# Patient Record
Sex: Male | Born: 1985 | Race: White | Hispanic: Yes | Marital: Married | Smoking: Current every day smoker
Health system: Southern US, Community
[De-identification: ages and names within clinical notes are randomized; demographics above are authoritative.]

## PROBLEM LIST (undated history)

## (undated) HISTORY — PX: HERNIA REPAIR: SHX51

---
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
## 2003-05-01 ENCOUNTER — Emergency Department (HOSPITAL_COMMUNITY): Admission: EM | Admit: 2003-05-01 | Discharge: 2003-05-02 | Payer: Self-pay | Admitting: Emergency Medicine

## 2003-07-07 ENCOUNTER — Emergency Department (HOSPITAL_COMMUNITY): Admission: EM | Admit: 2003-07-07 | Discharge: 2003-07-07 | Payer: Self-pay | Admitting: Emergency Medicine

## 2004-02-14 ENCOUNTER — Emergency Department (HOSPITAL_COMMUNITY): Admission: EM | Admit: 2004-02-14 | Discharge: 2004-02-14 | Payer: Self-pay | Admitting: Emergency Medicine

## 2004-02-17 ENCOUNTER — Emergency Department (HOSPITAL_COMMUNITY): Admission: EM | Admit: 2004-02-17 | Discharge: 2004-02-17 | Payer: Self-pay | Admitting: Emergency Medicine

## 2005-01-17 ENCOUNTER — Emergency Department (HOSPITAL_COMMUNITY): Admission: EM | Admit: 2005-01-17 | Discharge: 2005-01-17 | Payer: Self-pay | Admitting: Family Medicine

## 2005-07-24 ENCOUNTER — Emergency Department (HOSPITAL_COMMUNITY): Admission: EM | Admit: 2005-07-24 | Discharge: 2005-07-24 | Payer: Self-pay | Admitting: Emergency Medicine

## 2005-08-30 ENCOUNTER — Emergency Department (HOSPITAL_COMMUNITY): Admission: EM | Admit: 2005-08-30 | Discharge: 2005-08-30 | Payer: Self-pay | Admitting: Emergency Medicine

## 2005-09-03 ENCOUNTER — Emergency Department (HOSPITAL_COMMUNITY): Admission: EM | Admit: 2005-09-03 | Discharge: 2005-09-03 | Payer: Self-pay | Admitting: *Deleted

## 2006-01-23 ENCOUNTER — Emergency Department (HOSPITAL_COMMUNITY): Admission: EM | Admit: 2006-01-23 | Discharge: 2006-01-23 | Payer: Self-pay | Admitting: Emergency Medicine

## 2006-01-29 ENCOUNTER — Emergency Department (HOSPITAL_COMMUNITY): Admission: EM | Admit: 2006-01-29 | Discharge: 2006-01-29 | Payer: Self-pay | Admitting: Emergency Medicine

## 2006-02-13 ENCOUNTER — Emergency Department (HOSPITAL_COMMUNITY): Admission: EM | Admit: 2006-02-13 | Discharge: 2006-02-13 | Payer: Self-pay | Admitting: Emergency Medicine

## 2006-11-29 ENCOUNTER — Emergency Department (HOSPITAL_COMMUNITY): Admission: EM | Admit: 2006-11-29 | Discharge: 2006-11-29 | Payer: Self-pay | Admitting: Emergency Medicine

## 2007-08-15 IMAGING — CR DG CHEST 1V PORT
1 series · 1 of 1 positions shown · non-contrast
Comparison: 01/23/2006

CLINICAL DATA: Cough and congestion.  
 PORTABLE CHEST - 02/13/2006:

[view not recorded]
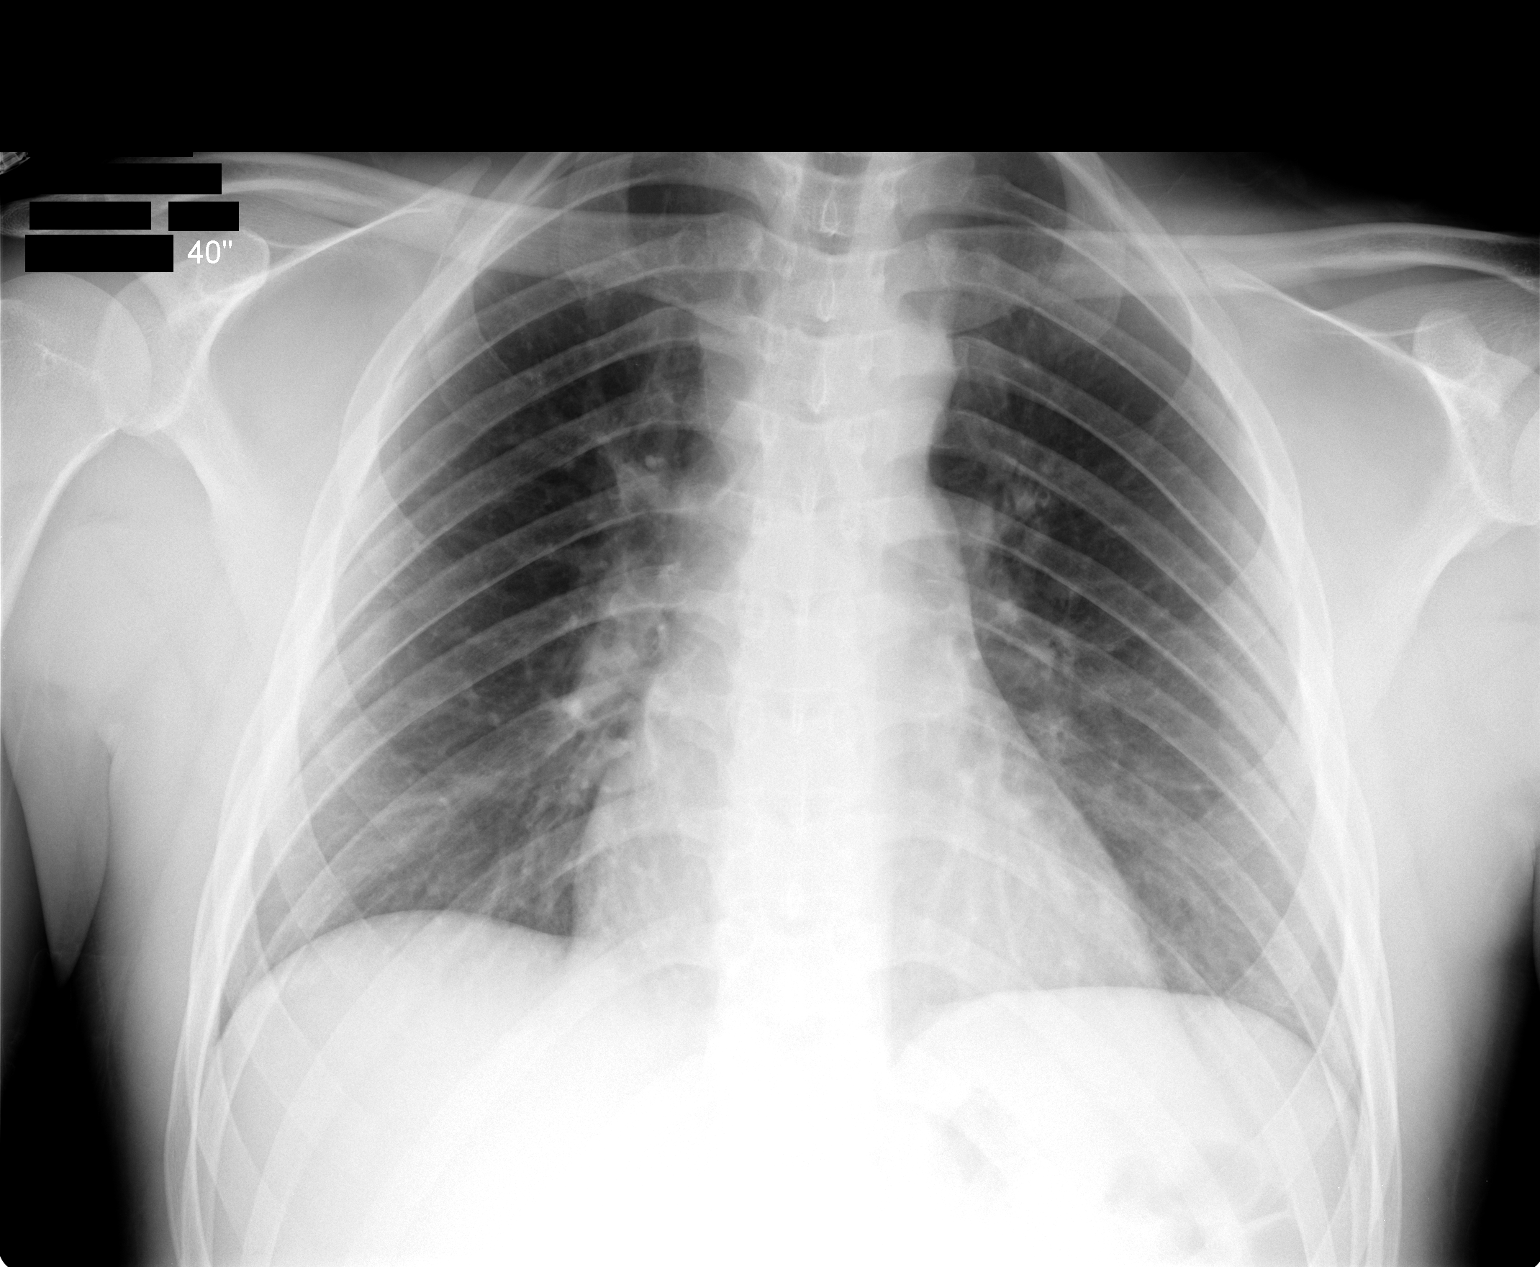

[1 of 1 positions shown; findings below may reference images not displayed]

FINDINGS: Lungs clear.  Costophrenic angle is sharp.  Heart size is normal.  No focal bony abnormality.
IMPRESSION: No acute disease.

## 2008-01-08 ENCOUNTER — Emergency Department (HOSPITAL_COMMUNITY): Admission: EM | Admit: 2008-01-08 | Discharge: 2008-01-08 | Payer: Self-pay | Admitting: Emergency Medicine

## 2008-01-15 ENCOUNTER — Emergency Department (HOSPITAL_COMMUNITY): Admission: EM | Admit: 2008-01-15 | Discharge: 2008-01-15 | Payer: Self-pay | Admitting: Emergency Medicine

## 2008-01-16 ENCOUNTER — Emergency Department (HOSPITAL_COMMUNITY): Admission: EM | Admit: 2008-01-16 | Discharge: 2008-01-17 | Payer: Self-pay | Admitting: Emergency Medicine

## 2008-01-18 ENCOUNTER — Emergency Department (HOSPITAL_COMMUNITY): Admission: EM | Admit: 2008-01-18 | Discharge: 2008-01-19 | Payer: Self-pay | Admitting: Emergency Medicine

## 2008-02-22 ENCOUNTER — Emergency Department (HOSPITAL_COMMUNITY): Admission: EM | Admit: 2008-02-22 | Discharge: 2008-02-22 | Payer: Self-pay | Admitting: Emergency Medicine

## 2009-04-13 ENCOUNTER — Emergency Department (HOSPITAL_COMMUNITY): Admission: EM | Admit: 2009-04-13 | Discharge: 2009-04-13 | Payer: Self-pay | Admitting: Family Medicine

## 2009-09-11 ENCOUNTER — Emergency Department (HOSPITAL_COMMUNITY): Admission: EM | Admit: 2009-09-11 | Discharge: 2009-09-11 | Payer: Self-pay | Admitting: Emergency Medicine

## 2009-11-11 ENCOUNTER — Emergency Department (HOSPITAL_COMMUNITY): Admission: EM | Admit: 2009-11-11 | Discharge: 2009-11-12 | Payer: Self-pay | Admitting: Emergency Medicine

## 2010-04-27 ENCOUNTER — Emergency Department (HOSPITAL_COMMUNITY): Payer: Self-pay

## 2010-04-27 ENCOUNTER — Emergency Department (HOSPITAL_COMMUNITY)
Admission: EM | Admit: 2010-04-27 | Discharge: 2010-04-27 | Disposition: A | Discharge status: Home / Self Care | Payer: Self-pay | Attending: Emergency Medicine | Admitting: Emergency Medicine

## 2010-04-27 DIAGNOSIS — S0003XA Contusion of scalp, initial encounter: Secondary | ICD-10-CM | POA: Insufficient documentation

## 2010-04-27 DIAGNOSIS — S1093XA Contusion of unspecified part of neck, initial encounter: Secondary | ICD-10-CM | POA: Insufficient documentation

## 2010-04-27 DIAGNOSIS — S0990XA Unspecified injury of head, initial encounter: Secondary | ICD-10-CM | POA: Insufficient documentation

## 2010-04-27 DIAGNOSIS — S025XXA Fracture of tooth (traumatic), initial encounter for closed fracture: Secondary | ICD-10-CM | POA: Insufficient documentation

## 2010-05-12 LAB — URINALYSIS, ROUTINE W REFLEX MICROSCOPIC
Bilirubin Urine: NEGATIVE
Glucose, UA: NEGATIVE mg/dL
Hgb urine dipstick: NEGATIVE
Ketones, ur: NEGATIVE mg/dL
Nitrite: NEGATIVE
Protein, ur: NEGATIVE mg/dL
Specific Gravity, Urine: 1.042 — ABNORMAL HIGH (ref 1.005–1.030)
Urobilinogen, UA: 0.2 mg/dL (ref 0.0–1.0)
pH: 5.5 (ref 5.0–8.0)

## 2010-05-12 LAB — COMPREHENSIVE METABOLIC PANEL
ALT: 28 U/L (ref 0–53)
AST: 29 U/L (ref 0–37)
Albumin: 4.4 g/dL (ref 3.5–5.2)
Alkaline Phosphatase: 62 U/L (ref 39–117)
BUN: 10 mg/dL (ref 6–23)
CO2: 24 mEq/L (ref 19–32)
Calcium: 9.2 mg/dL (ref 8.4–10.5)
Chloride: 105 mEq/L (ref 96–112)
Creatinine, Ser: 0.89 mg/dL (ref 0.4–1.5)
GFR calc Af Amer: >60 mL/min (ref >60)
GFR calc non Af Amer: >60 mL/min (ref >60)
Glucose, Bld: 97 mg/dL (ref 70–99)
Potassium: 3.9 mEq/L (ref 3.5–5.1)
Sodium: 138 mEq/L (ref 135–145)
Total Bilirubin: 1.1 mg/dL (ref 0.3–1.2)
Total Protein: 7.3 g/dL (ref 6.0–8.3)

## 2010-05-12 LAB — DIFFERENTIAL
Basophils Absolute: 0 10*3/uL (ref 0.0–0.1)
Basophils Relative: 0 % (ref 0–1)
Eosinophils Absolute: 0.1 10*3/uL (ref 0.0–0.7)
Eosinophils Relative: 2 % (ref 0–5)
Lymphocytes Relative: 18 % (ref 12–46)
Lymphs Abs: 1.1 10*3/uL (ref 0.7–4.0)
Monocytes Absolute: 0.2 10*3/uL (ref 0.1–1.0)
Monocytes Relative: 3 % (ref 3–12)
Neutro Abs: 4.8 10*3/uL (ref 1.7–7.7)
Neutrophils Relative %: 77 % (ref 43–77)

## 2010-05-12 LAB — CBC
HCT: 45.3 % (ref 39.0–52.0)
Hemoglobin: 15.7 g/dL (ref 13.0–17.0)
MCH: 32.2 pg (ref 26.0–34.0)
MCHC: 34.6 g/dL (ref 30.0–36.0)
MCV: 93.2 fL (ref 78.0–100.0)
Platelets: 186 10*3/uL (ref 150–400)
RBC: 4.86 MIL/uL (ref 4.22–5.81)
RDW: 11.8 % (ref 11.5–15.5)
WBC: 6.2 10*3/uL (ref 4.0–10.5)

## 2010-05-14 LAB — URINE MICROSCOPIC-ADD ON

## 2010-05-14 LAB — URINALYSIS, ROUTINE W REFLEX MICROSCOPIC
Bilirubin Urine: NEGATIVE
Glucose, UA: NEGATIVE mg/dL
Ketones, ur: NEGATIVE mg/dL
Leukocytes, UA: NEGATIVE
Nitrite: NEGATIVE
Protein, ur: NEGATIVE mg/dL
Specific Gravity, Urine: 1.023 (ref 1.005–1.030)
Urobilinogen, UA: 0.2 mg/dL (ref 0.0–1.0)
pH: 6.5 (ref 5.0–8.0)

## 2010-05-14 LAB — URINE CULTURE
Colony Count: NO GROWTH
Culture: NO GROWTH

## 2010-12-08 LAB — CBC
HCT: 46.9
Hemoglobin: 16.3
MCHC: 34.8
MCV: 92.3
Platelets: 229
RBC: 5.08
RDW: 12.8
WBC: 16.3 — ABNORMAL HIGH

## 2010-12-08 LAB — I-STAT 8, (EC8 V) (CONVERTED LAB)
Acid-base deficit: 8 — ABNORMAL HIGH
Chloride: 109
HCT: 50
Operator id: 277751
Potassium: 3.9
TCO2: 20
pCO2, Ven: 38.8 — ABNORMAL LOW

## 2010-12-08 LAB — DIFFERENTIAL
Lymphs Abs: 0.9
Monocytes Relative: 5
Neutro Abs: 14.5 — ABNORMAL HIGH
Neutrophils Relative %: 89 — ABNORMAL HIGH

## 2010-12-08 LAB — POCT I-STAT CREATININE
Creatinine, Ser: 1.2
Operator id: 277751

## 2010-12-08 LAB — ETHANOL: Alcohol, Ethyl (B): 129 — ABNORMAL HIGH

## 2011-11-28 DIAGNOSIS — IMO0001 Reserved for inherently not codable concepts without codable children: Secondary | ICD-10-CM | POA: Insufficient documentation

## 2011-11-28 DIAGNOSIS — R0989 Other specified symptoms and signs involving the circulatory and respiratory systems: Secondary | ICD-10-CM | POA: Insufficient documentation

## 2011-11-28 DIAGNOSIS — R05 Cough: Secondary | ICD-10-CM | POA: Insufficient documentation

## 2011-11-28 DIAGNOSIS — J3489 Other specified disorders of nose and nasal sinuses: Secondary | ICD-10-CM | POA: Insufficient documentation

## 2011-11-28 DIAGNOSIS — F17200 Nicotine dependence, unspecified, uncomplicated: Secondary | ICD-10-CM | POA: Insufficient documentation

## 2011-11-28 DIAGNOSIS — R059 Cough, unspecified: Secondary | ICD-10-CM | POA: Insufficient documentation

## 2011-11-28 DIAGNOSIS — R6889 Other general symptoms and signs: Secondary | ICD-10-CM | POA: Insufficient documentation

## 2011-11-28 DIAGNOSIS — R6883 Chills (without fever): Secondary | ICD-10-CM | POA: Insufficient documentation

## 2011-11-28 DIAGNOSIS — R0609 Other forms of dyspnea: Secondary | ICD-10-CM | POA: Insufficient documentation

## 2011-11-29 ENCOUNTER — Encounter (HOSPITAL_BASED_OUTPATIENT_CLINIC_OR_DEPARTMENT_OTHER): Payer: Self-pay | Admitting: *Deleted

## 2011-11-29 ENCOUNTER — Emergency Department (HOSPITAL_BASED_OUTPATIENT_CLINIC_OR_DEPARTMENT_OTHER)
Admission: EM | Admit: 2011-11-29 | Discharge: 2011-11-29 | Disposition: A | Discharge status: Home / Self Care | Payer: Self-pay | Attending: Emergency Medicine | Admitting: Emergency Medicine

## 2011-11-29 ENCOUNTER — Emergency Department (HOSPITAL_BASED_OUTPATIENT_CLINIC_OR_DEPARTMENT_OTHER): Payer: Self-pay

## 2011-11-29 DIAGNOSIS — J40 Bronchitis, not specified as acute or chronic: Secondary | ICD-10-CM

## 2011-11-29 LAB — CBC WITH DIFFERENTIAL/PLATELET
Basophils Absolute: 0 10*3/uL (ref 0.0–0.1)
Basophils Relative: 0 % (ref 0–1)
Eosinophils Relative: 2 % (ref 0–5)
Lymphocytes Relative: 29 % (ref 12–46)
MCHC: 35 g/dL (ref 30.0–36.0)
MCV: 91.5 fL (ref 78.0–100.0)
Monocytes Absolute: 0.7 10*3/uL (ref 0.1–1.0)
Platelets: 181 10*3/uL (ref 150–400)
RDW: 12.2 % (ref 11.5–15.5)
WBC: 8.4 10*3/uL (ref 4.0–10.5)

## 2011-11-29 LAB — BASIC METABOLIC PANEL
CO2: 24 mEq/L (ref 19–32)
Calcium: 9.1 mg/dL (ref 8.4–10.5)
Creatinine, Ser: 0.8 mg/dL (ref 0.50–1.35)
GFR calc Af Amer: >90 mL/min (ref >90)
GFR calc non Af Amer: >90 mL/min (ref >90)
Sodium: 140 mEq/L (ref 135–145)

## 2011-11-29 MED ORDER — ALBUTEROL SULFATE (5 MG/ML) 0.5% IN NEBU
5.0000 mg | INHALATION_SOLUTION | Freq: Once | RESPIRATORY_TRACT | Status: AC
  Administered 2011-11-29: 5 mg via RESPIRATORY_TRACT
  Filled 2011-11-29: qty 1

## 2011-11-29 MED ORDER — ALBUTEROL SULFATE HFA 108 (90 BASE) MCG/ACT IN AERS
2.0000 | INHALATION_SPRAY | RESPIRATORY_TRACT | Status: DC | PRN
  Administered 2011-11-29: 2 via RESPIRATORY_TRACT
  Filled 2011-11-29: qty 6.7

## 2011-11-29 MED ORDER — AZITHROMYCIN 250 MG PO TABS: 250.0000 mg | ORAL_TABLET | Freq: Every day | ORAL | Status: DC

## 2011-11-29 MED ORDER — IPRATROPIUM BROMIDE 0.02 % IN SOLN
0.5000 mg | Freq: Once | RESPIRATORY_TRACT | Status: AC
  Administered 2011-11-29: 0.5 mg via RESPIRATORY_TRACT
  Filled 2011-11-29: qty 2.5

## 2011-11-29 MED ORDER — HYDROCODONE-HOMATROPINE 5-1.5 MG/5ML PO SYRP: 5.0000 mL | ORAL_SOLUTION | Freq: Four times a day (QID) | ORAL | Status: DC | PRN

## 2011-11-29 NOTE — ED Notes (Addendum)
Pt c/o vomiting, cough, body aches and chills x6 days. Pt sts symptoms began as sore throat and sinus congestion.

## 2011-11-29 NOTE — ED Provider Notes (Signed)
History     CSN: [id number]  Arrival date & time 11/28/11  2358   First MD Initiated Contact with Patient 11/29/11 0115      Chief Complaint  Patient presents with  . Cough    (Consider location/radiation/quality/duration/timing/severity/associated sxs/prior treatment) HPI This is a 26 year old male with a one-week history of cough. The cough has been nonproductive. It worsened yesterday and is now moderate to severe. It is been accompanied by myalgias which he attributes to coughing. He denies fever but states he has had periods of feeling hot periods of chills. He is had nasal congestion and a scratchy throat. He has had posttussive emesis. He denies abdominal pain or diarrhea. He has had dyspnea, worse with exertion. He states his appetite has been fine but he has been drinking more than eating. He denies risk factors for HIV.  History reviewed. No pertinent past medical history.  History reviewed. No pertinent past surgical history.  No family history on file.  History  Substance Use Topics  . Smoking status: Current Every Day Smoker  . Smokeless tobacco: Not on file  . Alcohol Use: No      Review of Systems  All other systems reviewed and are negative.    Allergies  Review of patient's allergies indicates no known allergies.  Home Medications  No current outpatient prescriptions on file.  BP 123/77  Pulse 78  Temp 97.6 F (36.4 C) (Oral)  Resp 16  Wt 210 lb (95.255 kg)  SpO2 99%  Physical Exam General: Well-developed, well-nourished male in no acute distress; appearance consistent with age of record HENT: normocephalic, atraumatic; pupils membranes moist; no pharyngeal erythema or exudate Eyes: pupils equal round and reactive to light; extraocular muscles intact Neck: supple Heart: regular rate and rhythm Lungs: Decreased air movement bilaterally without frank wheezing Abdomen: soft; nondistended; nontender Extremities: No deformity; full range of  motion; pulses normal; no edema Neurologic: Awake, alert and oriented; motor function intact in all extremities and symmetric; no facial droop Skin: Warm and dry Psychiatric: Flat affect    ED Course  Procedures (including critical care time)    MDM   Nursing notes and vitals signs, including pulse oximetry, reviewed.  Summary of this visit's results, reviewed by myself:  Labs:  Results for orders placed during the hospital encounter of 11/29/11  CBC WITH DIFFERENTIAL      Component Value Range   WBC 8.4  4.0 - 10.5 K/uL   RBC 4.34  4.22 - 5.81 MIL/uL   Hemoglobin 13.9  13.0 - 17.0 g/dL   HCT 40.9  81.1 - 91.4 %   MCV 91.5  78.0 - 100.0 fL   MCH 32.0  26.0 - 34.0 pg   MCHC 35.0  30.0 - 36.0 g/dL   RDW 78.2  95.6 - 21.3 %   Platelets 181  150 - 400 K/uL   Neutrophils Relative 61  43 - 77 %   Neutro Abs 5.2  1.7 - 7.7 K/uL   Lymphocytes Relative 29  12 - 46 %   Lymphs Abs 2.4  0.7 - 4.0 K/uL   Monocytes Relative 8  3 - 12 %   Monocytes Absolute 0.7  0.1 - 1.0 K/uL   Eosinophils Relative 2  0 - 5 %   Eosinophils Absolute 0.2  0.0 - 0.7 K/uL   Basophils Relative 0  0 - 1 %   Basophils Absolute 0.0  0.0 - 0.1 K/uL  BASIC METABOLIC PANEL  Component Value Range   Sodium 140  135 - 145 mEq/L   Potassium 3.6  3.5 - 5.1 mEq/L   Chloride 104  96 - 112 mEq/L   CO2 24  19 - 32 mEq/L   Glucose, Bld 95  70 - 99 mg/dL   BUN 17  6 - 23 mg/dL   Creatinine, Ser 1.61  0.50 - 1.35 mg/dL   Calcium 9.1  8.4 - 09.6 mg/dL   GFR calc non Af Amer >90  >90 mL/min   GFR calc Af Amer >90  >90 mL/min    Imaging Studies: Dg Chest 2 View  11/29/2011  *RADIOLOGY REPORT*  Clinical Data: Cough  CHEST - 2 VIEW  Comparison: 02/13/2006  Findings: Lungs are clear. No pleural effusion or pneumothorax. The cardiomediastinal contours are within normal limits. The visualized bones and soft tissues are without significant appreciable abnormality.  IMPRESSION: No radiographic evidence of acute  cardiopulmonary process.   Original Report Authenticated By: Waneta Martins, M.D.     2:23 AM Air movement improved after albuterol and Atrovent neb treatment. As the patient has been sick for about a week we will add an antibiotic and antitussive.        Hanley Seamen, MD 11/29/11 [phone]

## 2012-01-09 ENCOUNTER — Encounter (HOSPITAL_BASED_OUTPATIENT_CLINIC_OR_DEPARTMENT_OTHER): Payer: Self-pay | Admitting: *Deleted

## 2012-01-09 ENCOUNTER — Emergency Department (HOSPITAL_BASED_OUTPATIENT_CLINIC_OR_DEPARTMENT_OTHER)
Admission: EM | Admit: 2012-01-09 | Discharge: 2012-01-09 | Disposition: A | Discharge status: Home / Self Care | Payer: Self-pay | Attending: Emergency Medicine | Admitting: Emergency Medicine

## 2012-01-09 ENCOUNTER — Emergency Department (HOSPITAL_BASED_OUTPATIENT_CLINIC_OR_DEPARTMENT_OTHER): Payer: Self-pay

## 2012-01-09 DIAGNOSIS — Z87891 Personal history of nicotine dependence: Secondary | ICD-10-CM | POA: Insufficient documentation

## 2012-01-09 DIAGNOSIS — B9789 Other viral agents as the cause of diseases classified elsewhere: Secondary | ICD-10-CM | POA: Insufficient documentation

## 2012-01-09 DIAGNOSIS — B349 Viral infection, unspecified: Secondary | ICD-10-CM

## 2012-01-09 DIAGNOSIS — IMO0001 Reserved for inherently not codable concepts without codable children: Secondary | ICD-10-CM | POA: Insufficient documentation

## 2012-01-09 DIAGNOSIS — Z792 Long term (current) use of antibiotics: Secondary | ICD-10-CM | POA: Insufficient documentation

## 2012-01-09 DIAGNOSIS — J029 Acute pharyngitis, unspecified: Secondary | ICD-10-CM | POA: Insufficient documentation

## 2012-01-09 MED ORDER — ONDANSETRON 4 MG PO TBDP: 4.0000 mg | ORAL_TABLET | Freq: Three times a day (TID) | ORAL | Status: DC | PRN

## 2012-01-09 MED ORDER — ONDANSETRON 4 MG PO TBDP
4.0000 mg | ORAL_TABLET | Freq: Once | ORAL | Status: AC
  Administered 2012-01-09: 4 mg via ORAL
  Filled 2012-01-09: qty 1

## 2012-01-09 NOTE — ED Provider Notes (Signed)
Medical screening examination/treatment/procedure(s) were performed by non-physician practitioner and as supervising physician I was immediately available for consultation/collaboration.   Rolan Bucco, MD 01/09/12 [phone]

## 2012-01-09 NOTE — ED Notes (Signed)
Cough 2 weeks. States he has a hx of bronchitis. Family have all been ill with a cough.

## 2012-01-09 NOTE — ED Provider Notes (Signed)
History     CSN: [id number]  Arrival date & time 01/09/12  1600   First MD Initiated Contact with Patient 01/09/12 1614      Chief Complaint  Patient presents with  . Cough    (Consider location/radiation/quality/duration/timing/severity/associated sxs/prior treatment) HPI Comments: Pt states that he had bronchitis about 2 weeks ago but now the cough won't go away and he is achy and vomiting:pt denies fever  Patient is a 26 y.o. male presenting with cough. The history is provided by the patient. No language interpreter was used.  Cough This is a new problem. The current episode started more than 1 week ago. The problem occurs constantly. The problem has not changed since onset.The cough is productive of sputum. There has been no fever. Associated symptoms include sore throat and myalgias. He has tried nothing for the symptoms. He is a smoker. His past medical history is significant for bronchitis.    History reviewed. No pertinent past medical history.  History reviewed. No pertinent past surgical history.  No family history on file.  History  Substance Use Topics  . Smoking status: Current Every Day Smoker -- 0.5 packs/day    Types: Cigarettes  . Smokeless tobacco: Not on file  . Alcohol Use: No      Review of Systems  Constitutional: Negative.   HENT: Positive for sore throat.   Respiratory: Positive for cough.   Musculoskeletal: Positive for myalgias.    Allergies  Review of patient's allergies indicates no known allergies.  Home Medications   Current Outpatient Rx  Name  Route  Sig  Dispense  Refill  . AZITHROMYCIN 250 MG PO TABS   Oral   Take 1 tablet (250 mg total) by mouth daily. Take first 2 tablets together, then 1 every day until finished.   6 tablet   0   . HYDROCODONE-HOMATROPINE 5-1.5 MG/5ML PO SYRP   Oral   Take 5 mLs by mouth every 6 (six) hours as needed for cough.   120 mL   0     BP 118/77  Pulse 99  Resp 20  SpO2 98%  Physical  Exam  Nursing note and vitals reviewed. Constitutional: He is oriented to person, place, and time. He appears well-developed and well-nourished.  HENT:  Head: Normocephalic and atraumatic.  Right Ear: External ear normal.  Left Ear: External ear normal.  Eyes: Conjunctivae normal and EOM are normal. Pupils are equal, round, and reactive to light.  Neck: Neck supple.  Cardiovascular: Normal rate and regular rhythm.   Pulmonary/Chest: Effort normal and breath sounds normal.  Abdominal: Soft. Bowel sounds are normal. There is no tenderness.  Musculoskeletal: Normal range of motion.  Neurological: He is alert and oriented to person, place, and time.  Skin: Skin is warm and dry.  Psychiatric: He has a normal mood and affect.    ED Course  Procedures (including critical care time)  Labs Reviewed - No data to display Dg Chest 2 View  01/09/2012  *RADIOLOGY REPORT*  Clinical Data: Cough, fever  CHEST - 2 VIEW  Comparison: 11/29/2011  Findings: Cardiomediastinal silhouette is stable.  No acute infiltrate or pleural effusion.  No pulmonary edema.  Bony thorax is unremarkable.  IMPRESSION: No active disease.   Original Report Authenticated By: Natasha Mead, M.D.      1. Viral illness       MDM  Pt is tolerating po without any problem:pt is okay to follow up as needed:no sign of pneumonia:pt has other  family members with similar symptoms:likely viral        Teressa Lower, NP 01/09/12 1809

## 2012-08-01 ENCOUNTER — Emergency Department (HOSPITAL_BASED_OUTPATIENT_CLINIC_OR_DEPARTMENT_OTHER)
Admission: EM | Admit: 2012-08-01 | Discharge: 2012-08-02 | Disposition: A | Discharge status: Home / Self Care | Payer: BC Managed Care – PPO | Attending: Emergency Medicine | Admitting: Emergency Medicine

## 2012-08-01 ENCOUNTER — Other Ambulatory Visit: Payer: Self-pay

## 2012-08-01 ENCOUNTER — Emergency Department (HOSPITAL_BASED_OUTPATIENT_CLINIC_OR_DEPARTMENT_OTHER): Payer: BC Managed Care – PPO

## 2012-08-01 ENCOUNTER — Encounter (HOSPITAL_BASED_OUTPATIENT_CLINIC_OR_DEPARTMENT_OTHER): Payer: Self-pay | Admitting: *Deleted

## 2012-08-01 DIAGNOSIS — Z8719 Personal history of other diseases of the digestive system: Secondary | ICD-10-CM | POA: Insufficient documentation

## 2012-08-01 DIAGNOSIS — R42 Dizziness and giddiness: Secondary | ICD-10-CM | POA: Insufficient documentation

## 2012-08-01 DIAGNOSIS — J209 Acute bronchitis, unspecified: Secondary | ICD-10-CM | POA: Insufficient documentation

## 2012-08-01 DIAGNOSIS — F17200 Nicotine dependence, unspecified, uncomplicated: Secondary | ICD-10-CM | POA: Insufficient documentation

## 2012-08-01 DIAGNOSIS — J208 Acute bronchitis due to other specified organisms: Secondary | ICD-10-CM

## 2012-08-01 DIAGNOSIS — R0602 Shortness of breath: Secondary | ICD-10-CM | POA: Insufficient documentation

## 2012-08-01 DIAGNOSIS — R079 Chest pain, unspecified: Secondary | ICD-10-CM | POA: Insufficient documentation

## 2012-08-01 LAB — RAPID URINE DRUG SCREEN, HOSP PERFORMED
Benzodiazepines: NOT DETECTED
Opiates: NOT DETECTED

## 2012-08-01 LAB — URINALYSIS, ROUTINE W REFLEX MICROSCOPIC
Glucose, UA: NEGATIVE mg/dL
Ketones, ur: NEGATIVE mg/dL
Leukocytes, UA: NEGATIVE
Nitrite: NEGATIVE
Protein, ur: NEGATIVE mg/dL
pH: 5.5 (ref 5.0–8.0)

## 2012-08-01 LAB — CBC WITH DIFFERENTIAL/PLATELET
Basophils Absolute: 0 10*3/uL (ref 0.0–0.1)
Eosinophils Relative: 2 % (ref 0–5)
Lymphocytes Relative: 26 % (ref 12–46)
Lymphs Abs: 2.1 10*3/uL (ref 0.7–4.0)
MCV: 91.2 fL (ref 78.0–100.0)
Neutro Abs: 5.2 10*3/uL (ref 1.7–7.7)
Neutrophils Relative %: 65 % (ref 43–77)
Platelets: 188 10*3/uL (ref 150–400)
RBC: 4.67 MIL/uL (ref 4.22–5.81)
RDW: 12.5 % (ref 11.5–15.5)
WBC: 8 10*3/uL (ref 4.0–10.5)

## 2012-08-01 LAB — BASIC METABOLIC PANEL
CO2: 27 mEq/L (ref 19–32)
Calcium: 9.5 mg/dL (ref 8.4–10.5)
Chloride: 105 mEq/L (ref 96–112)
Glucose, Bld: 134 mg/dL — ABNORMAL HIGH (ref 70–99)
Potassium: 3.5 mEq/L (ref 3.5–5.1)
Sodium: 141 mEq/L (ref 135–145)

## 2012-08-01 LAB — TROPONIN I: Troponin I: <0.3 ng/mL (ref <0.30)

## 2012-08-01 LAB — D-DIMER, QUANTITATIVE: D-Dimer, Quant: <0.27 ug/mL-FEU (ref 0.00–0.48)

## 2012-08-01 MED ORDER — NITROGLYCERIN 0.4 MG SL SUBL
0.4000 mg | SUBLINGUAL_TABLET | SUBLINGUAL | Status: DC | PRN
  Filled 2012-08-01: qty 25

## 2012-08-01 MED ORDER — ASPIRIN 81 MG PO CHEW
162.0000 mg | CHEWABLE_TABLET | Freq: Once | ORAL | Status: AC
  Administered 2012-08-01: 162 mg via ORAL
  Filled 2012-08-01: qty 2

## 2012-08-01 NOTE — ED Notes (Addendum)
Chest pain since this am. Denies drug usage. He looks sob. Admits to seeing blood in his stools for a few days. Denies abdominal pain or constipation. Thinks he may have hemorrhoids but has not seen a doctor. States he feels tired. Looks sick.

## 2012-08-01 NOTE — ED Provider Notes (Addendum)
History     CSN: [id number]  Arrival date & time 08/01/12  2115   First MD Initiated Contact with Patient 08/01/12 2135      Chief Complaint  Patient presents with  . Chest Pain    (Consider location/radiation/quality/duration/timing/severity/associated sxs/prior treatment) HPI Comments: Pt comes in with cc of chest pain. Pt has no medical hx, no hx of chest pain. Pt reports starting to have some chest discomfort in the afternoon, described as pressure. There is mild dyspnea associated with this. The pain has been constant, but is not pleuritic, exertional or worse with palpation. He denies any trauma, cough. Pt smoked about 1/2 pack a day- he denies any illicit drug use.  Patient is a 27 y.o. male presenting with chest pain. The history is provided by the patient.  Chest Pain Associated symptoms: dizziness and shortness of breath   Associated symptoms: no cough, no fever and no headache     History reviewed. No pertinent past medical history.  Past Surgical History  Procedure Laterality Date  . Hernia repair      No family history on file.  History  Substance Use Topics  . Smoking status: Current Every Day Smoker -- 0.50 packs/day    Types: Cigarettes  . Smokeless tobacco: Not on file  . Alcohol Use: No      Review of Systems  Constitutional: Negative for fever, chills and activity change.  HENT: Negative for neck pain.   Eyes: Negative for visual disturbance.  Respiratory: Positive for shortness of breath. Negative for cough and wheezing.   Cardiovascular: Positive for chest pain.  Gastrointestinal: Negative for abdominal distention.  Genitourinary: Negative for dysuria, enuresis and difficulty urinating.  Musculoskeletal: Negative for arthralgias.  Neurological: Positive for dizziness. Negative for light-headedness and headaches.  Psychiatric/Behavioral: Negative for confusion.    Allergies  Review of patient's allergies indicates no known allergies.  Home  Medications   Current Outpatient Rx  Name  Route  Sig  Dispense  Refill  . azithromycin (ZITHROMAX) 250 MG tablet   Oral   Take 1 tablet (250 mg total) by mouth daily. Take first 2 tablets together, then 1 every day until finished.   6 tablet   0   . HYDROcodone-homatropine (HYCODAN) 5-1.5 MG/5ML syrup   Oral   Take 5 mLs by mouth every 6 (six) hours as needed for cough.   120 mL   0   . ondansetron (ZOFRAN ODT) 4 MG disintegrating tablet   Oral   Take 1 tablet (4 mg total) by mouth every 8 (eight) hours as needed for nausea.   20 tablet   0     BP 124/82  Pulse 104  Temp(Src) 98.8 F (37.1 C) (Oral)  Resp 24  Ht 5\' 11"  (1.803 m)  Wt 210 lb (95.255 kg)  BMI 29.3 kg/m2  SpO2 100%  Physical Exam  Nursing note and vitals reviewed. Constitutional: He is oriented to person, place, and time. He appears well-developed.  HENT:  Head: Normocephalic and atraumatic.  Eyes: Conjunctivae and EOM are normal. Pupils are equal, round, and reactive to light.  Neck: Normal range of motion. Neck supple.  Cardiovascular: Normal rate and regular rhythm.   Pulmonary/Chest: Effort normal and breath sounds normal.  Abdominal: Soft. Bowel sounds are normal. He exhibits no distension. There is no tenderness. There is no rebound and no guarding.  Neurological: He is alert and oriented to person, place, and time.  Skin: Skin is warm.    ED  Course  Procedures (including critical care time)  Labs Reviewed  BASIC METABOLIC PANEL - Abnormal; Notable for the following:    Glucose, Bld 134 (*)    All other components within normal limits  CBC WITH DIFFERENTIAL  TROPONIN I  URINALYSIS, ROUTINE W REFLEX MICROSCOPIC  URINE RAPID DRUG SCREEN (HOSP PERFORMED)  D-DIMER, QUANTITATIVE   Dg Chest 1 View  08/01/2012   *RADIOLOGY REPORT*  Clinical Data: Mid chest pain; history of smoking.  CHEST - 1 VIEW  Comparison: Chest radiograph performed 01/09/2012  Findings: The lungs are well-aerated.   Minimally increased interstitial markings appear chronic in nature.  There is no evidence of focal opacification, pleural effusion or pneumothorax.  The cardiomediastinal silhouette is within normal limits.  No acute osseous abnormalities are seen.  IMPRESSION: No acute cardiopulmonary process seen.   Original Report Authenticated By: Tonia Ghent, M.D.     No diagnosis found.    MDM   Date: 08/01/2012  Rate: 102  Rhythm: sinus tachycardia  QRS Axis: normal  Intervals: normal  ST/T Wave abnormalities: nonspecific ST/T changes  Conduction Disutrbances:none  Narrative Interpretation:   Old EKG Reviewed: none available  Differential diagnosis includes: ACS syndrome CHF exacerbation Valvular disorder Myocarditis Pericarditis Pericardial effusion Pneumonia Pleural effusion Pulmonary edema PE Anemia Musculoskeletal pain  Pt comes in with cc of chest pain. Pt has smoking as the only cardiac risk factor. The hx and exam is not suggestive of specific source for pain. With dyspnea and tachycardia, we will get d-dimer for PE screen. On the cardiac side, we think 2 troponin and 2 EKG should be sufficient with a PCP follow up.     Derwood Kaplan, MD 08/01/12 2325   Date: 08/01/2012  Rate: 82  Rhythm: normal sinus rhythm  QRS Axis: normal  Intervals: normal  ST/T Wave abnormalities: non specific changes  Conduction Disutrbances: none  Narrative Interpretation: unremarkable      Derwood Kaplan, MD 08/01/12 2330

## 2012-08-02 LAB — TROPONIN I: Troponin I: <0.3 ng/mL (ref <0.30)

## 2012-11-10 ENCOUNTER — Ambulatory Visit (INDEPENDENT_AMBULATORY_CARE_PROVIDER_SITE_OTHER): Payer: BC Managed Care – PPO | Admitting: Internal Medicine

## 2012-11-10 VITALS — BP 116/84 | HR 114 | Temp 102.3°F | Resp 18 | Ht 71.0 in | Wt 204.2 lb

## 2012-11-10 DIAGNOSIS — R509 Fever, unspecified: Secondary | ICD-10-CM

## 2012-11-10 DIAGNOSIS — K122 Cellulitis and abscess of mouth: Secondary | ICD-10-CM

## 2012-11-10 DIAGNOSIS — J029 Acute pharyngitis, unspecified: Secondary | ICD-10-CM

## 2012-11-10 DIAGNOSIS — J039 Acute tonsillitis, unspecified: Secondary | ICD-10-CM

## 2012-11-10 MED ORDER — HYDROCODONE-ACETAMINOPHEN 7.5-325 MG/15ML PO SOLN: 5.0000 mL | Freq: Four times a day (QID) | ORAL | Status: DC | PRN

## 2012-11-10 MED ORDER — CEFTRIAXONE SODIUM 1 G IJ SOLR
1.0000 g | INTRAMUSCULAR | Status: DC
  Administered 2012-11-10: 1 g via INTRAMUSCULAR

## 2012-11-10 MED ORDER — AMOXICILLIN 500 MG PO CAPS: 1000.0000 mg | ORAL_CAPSULE | Freq: Two times a day (BID) | ORAL | Status: DC

## 2012-11-10 NOTE — Progress Notes (Signed)
  Subjective:    Patient ID: Mason Perez, male    DOB: 1985/06/06, 27 y.o.   MRN: [id number]  HPI 27 year old Hispanic male is here to with complaints of sore throat and fever. Pt states these symptoms began last night and got worse this morning. Pt states no one is sick in his home. Pt has no other complaints. Voice changing, unable to swallow., no problems breathing. Usually very healthy   Review of Systems neg    Objective:   Physical Exam  Vitals reviewed. Constitutional: He is oriented to person, place, and time. He appears well-developed and well-nourished. No distress.  HENT:  Mouth/Throat: No trismus in the jaw. Edematous present. Posterior oropharyngeal edema and posterior oropharyngeal erythema present. No tonsillar abscesses.  Eyes: Conjunctivae and EOM are normal. Pupils are equal, round, and reactive to light.  Neck: Normal range of motion. Neck supple.  Cardiovascular: Regular rhythm and normal heart sounds.  Tachycardia present.   Pulmonary/Chest: Effort normal.  Abdominal: Soft.  Musculoskeletal: Normal range of motion.  Lymphadenopathy:    He has cervical adenopathy.  Neurological: He is alert and oriented to person, place, and time. No cranial nerve deficit. He exhibits normal muscle tone. Coordination normal.  Psychiatric: He has a normal mood and affect.     Results for orders placed in visit on 11/10/12  POCT RAPID STREP A (OFFICE)      Result Value Range   Rapid Strep A Screen Negative  Negative        Assessment & Plan:  Tonsillitis Rocephin 1g/amoxil 1g bid Lortab

## 2012-11-10 NOTE — Patient Instructions (Signed)
Fever   Fever is a higher-than-normal body temperature. A normal temperature varies with:   Age.   How it is measured (mouth, underarm, rectal, or ear).   Time of day.  In an adult, an oral temperature around 98.6 Fahrenheit (F) or 37 Celsius (C) is considered normal. A rise in temperature of about 1.8 F or 1 C is generally considered a fever (100.4 F or 38 C). In an infant age 28 days or less, a rectal temperature of 100.4 F (38 C) generally is regarded as fever. Fever is not a disease but can be a symptom of illness.  CAUSES    Fever is most commonly caused by infection.   Some non-infectious problems can cause fever. For example:   Some arthritis problems.   Problems with the thyroid or adrenal glands.   Immune system problems.   Some kinds of cancer.   A reaction to certain medicines.   Occasionally, the source of a fever cannot be determined. This is sometimes called a "Fever of Unknown Origin" (FUO).   Some situations may lead to a temporary rise in body temperature that may go away on its own. Examples are:   Childbirth.   Surgery.   Some situations may cause a rise in body temperature but these are not considered "true fever". Examples are:   Intense exercise.   Dehydration.   Exposure to high outside or room temperatures.  SYMPTOMS    Feeling warm or hot.   Fatigue or feeling exhausted.   Aching all over.   Chills.   Shivering.   Sweats.  DIAGNOSIS   A fever can be suspected by your caregiver feeling that your skin is unusually warm. The fever is confirmed by taking a temperature with a thermometer. Temperatures can be taken different ways. Some methods are accurate and some are not:  With adults, adolescents, and children:    An oral temperature is used most commonly.   An ear thermometer will only be accurate if it is positioned as recommended by the manufacturer.   Under the arm temperatures are not accurate and not recommended.   Most electronic thermometers are fast  and accurate.  Infants and Toddlers:   Rectal temperatures are recommended and most accurate.   Ear temperatures are not accurate in this age group and are not recommended.   Skin thermometers are not accurate.  RISKS AND COMPLICATIONS    During a fever, the body uses more oxygen, so a person with a fever may develop rapid breathing or shortness of breath. This can be dangerous especially in people with heart or lung disease.   The sweats that occur following a fever can cause dehydration.   High fever can cause seizures in infants and children.   Older persons can develop confusion during a fever.  TREATMENT    Medications may be used to control temperature.   Do not give aspirin to children with fevers. There is an association with Reye's syndrome. Reye's syndrome is a rare but potentially deadly disease.   If an infection is present and medications have been prescribed, take them as directed. Finish the full course of medications until they are gone.   Sponging or bathing with room-temperature water may help reduce body temperature. Do not use ice water or alcohol sponge baths.   Do not over-bundle children in blankets or heavy clothes.   Drinking adequate fluids during an illness with fever is important to prevent dehydration.  HOME CARE INSTRUCTIONS      help with comfort. The amount to be given is based on the child's weight. Do NOT give more than is recommended. SEEK MEDICAL CARE IF:   You or your child are unable to keep fluids down.  Vomiting or diarrhea develops.  You develop a skin rash.  An oral temperature above 102 F (38.9 C) develops, or a fever which persists for over 3  days.  You develop excessive weakness, dizziness, fainting or extreme thirst.  Fevers keep coming back after 3 days. SEEK IMMEDIATE MEDICAL CARE IF:   Shortness of breath or trouble breathing develops  You pass out.  You feel you are making little or no urine.  New pain develops that was not there before (such as in the head, neck, chest, back, or abdomen).  You cannot hold down fluids.  Vomiting and diarrhea persist for more than a day or two.  You develop a stiff neck and/or your eyes become sensitive to light.  An unexplained temperature above 102 F (38.9 C) develops. Document Released: 02/13/2005 Document Revised: 05/08/2011 Document Reviewed: 01/30/2008 [facility] Patient Information 2014 [place], [place]. Tonsillitis Tonsils are lumps of lymphoid tissues at the back of the throat. Each tonsil has 20 crevices (crypts). Tonsils help fight nose and throat infections and keep infection from spreading to other parts of the body for the first 18 months of life. Tonsillitis is an infection of the throat that causes the tonsils to become red, tender, and swollen. CAUSES Sudden and, if treated, temporary (acute) tonsillitis is usually caused by infection with streptococcal bacteria. Long lasting (chronic) tonsillitis occurs when the crypts of the tonsils become filled with pieces of food and bacteria, which makes it easy for the tonsils to become constantly infected. SYMPTOMS  Symptoms of tonsillitis include:  A sore throat.  White patches on the tonsils.  Fever.  Tiredness. DIAGNOSIS Tonsillitis can be diagnosed through a physical exam. Diagnosis can be confirmed with the results of lab tests, including a throat culture. TREATMENT  The goals of tonsillitis treatment include the reduction of the severity and duration of symptoms, prevention of associated conditions, and prevention of disease transmission. Tonsillitis caused by bacteria can be treated with antibiotics. Usually,  treatment with antibiotics is started before the cause of the tonsillitis is known. However, if it is determined that the cause is not bacterial, antibiotics will not treat the tonsillitis. If attacks of tonsillitis are severe and frequent, your caregiver may recommend surgery to remove the tonsils (tonsillectomy). HOME CARE INSTRUCTIONS   Rest as much as possible and get plenty of sleep.  Drink plenty of fluids. While the throat is very sore, eat soft foods or liquids, such as sherbet, soups, or instant breakfast drinks.  Eat frozen ice pops.  Older children and adults may gargle with a warm or cold liquid to help soothe the throat. Mix 1 teaspoon of salt in 1 cup of water.  Other family members who also develop a sore throat or fever should have a medical exam or throat culture.  Only take over-the-counter or prescription medicines for pain, discomfort, or fever as directed by your caregiver.  If you are given antibiotics, take them as directed. Finish them even if you start to feel better. SEEK MEDICAL CARE IF:   Your baby is older than 3 months with a rectal temperature of 100.5 F (38.1 C) or higher for more than 1 day.  Large, tender lumps develop in your neck.  A rash develops.  Green, yellow-brown, or bloody substance is  coughed up.  You are unable to swallow liquids or food for 24 hours.  Your child is unable to swallow food or liquids for 12 hours. SEEK IMMEDIATE MEDICAL CARE IF:   You develop any new symptoms such as vomiting, severe headache, stiff neck, chest pain, or trouble breathing or swallowing.  You have severe throat pain along with drooling or voice changes.  You have severe pain, unrelieved with recommended medications.  You are unable to fully open the mouth.  You develop redness, swelling, or severe pain anywhere in the neck.  You have a fever.  Your baby is older than 3 months with a rectal temperature of 102 F (38.9 C) or higher.  Your baby is  67 months old or younger with a rectal temperature of 100.4 F (38 C) or higher. MAKE SURE YOU:   Understand these instructions.  Will watch your condition.  Will get help right away if you are not doing well or get worse. Document Released: 11/23/2004 Document Revised: 05/08/2011 Document Reviewed: 04/21/2010 [facility] Patient Information 2014 [place], [place].

## 2012-11-13 LAB — CULTURE, GROUP A STREP: Organism ID, Bacteria: NORMAL

## 2012-11-19 ENCOUNTER — Ambulatory Visit (INDEPENDENT_AMBULATORY_CARE_PROVIDER_SITE_OTHER): Payer: BC Managed Care – PPO | Admitting: Internal Medicine

## 2012-11-19 VITALS — BP 122/72 | HR 98 | Temp 99.9°F | Resp 18 | Ht 71.25 in | Wt 203.8 lb

## 2012-11-19 DIAGNOSIS — F17200 Nicotine dependence, unspecified, uncomplicated: Secondary | ICD-10-CM

## 2012-11-19 DIAGNOSIS — J039 Acute tonsillitis, unspecified: Secondary | ICD-10-CM

## 2012-11-19 LAB — POCT CBC
HCT, POC: 44.5 % (ref 43.5–53.7)
Hemoglobin: 14.2 g/dL (ref 14.1–18.1)
Lymph, poc: 2.2 (ref 0.6–3.4)
MCH, POC: 31.4 pg — AB (ref 27–31.2)
MCV: 98.5 fL — AB (ref 80–97)
MID (cbc): 1 — AB (ref 0–0.9)
MPV: 8.6 fL (ref 0–99.8)
POC Granulocyte: 13.4 — AB (ref 2–6.9)
RBC: 4.52 M/uL — AB (ref 4.69–6.13)
WBC: 16.5 10*3/uL — AB (ref 4.6–10.2)

## 2012-11-19 MED ORDER — AMOXICILLIN-POT CLAVULANATE 875-125 MG PO TABS: 1.0000 | ORAL_TABLET | Freq: Two times a day (BID) | ORAL | Status: DC

## 2012-11-19 MED ORDER — CEFTRIAXONE SODIUM 1 G IJ SOLR
1.0000 g | INTRAMUSCULAR | Status: DC
  Administered 2012-11-19: 1 g via INTRAMUSCULAR

## 2012-11-19 NOTE — Progress Notes (Signed)
  Subjective:    Patient ID: Mason Perez, male    DOB: [birth year], 27 y.o.   MRN: [id number]  HPIat alst visit had roceph and amox and did well for 5-6 d but has relapsed before finishing antibios Fever/ST/fatigue/dysphag No cough No rash  No runy nose  Owns own company  Review of Systems No noght sweats No wt loss Appetite poor No chest pain, palp or doe No gi/gu sxt    Objective:   Physical Exam Uncomfortable BP 122/72  Pulse 98  Temp(Src) 99.9 F (37.7 C) (Oral)  Resp 18  Ht 5' 11.25" (1.81 m)  Wt 203 lb 12.8 oz (92.443 kg)  BMI 28.22 kg/m2  SpO2 97% Conj cl Tms cl Nares cl thr red,swollen,minimal exud No asymmetry 1 +AC nodes tender Chest clear     Results for orders placed in visit on 11/19/12  POCT CBC      Result Value Range   WBC 16.5 (*) 4.6 - 10.2 K/uL   Lymph, poc 2.2  0.6 - 3.4   POC LYMPH PERCENT 13.2  10 - 50 %L   MID (cbc) 1.0 (*) 0 - 0.9   POC MID % 5.8  0 - 12 %M   POC Granulocyte 13.4 (*) 2 - 6.9   Granulocyte percent 81.0 (*) 37 - 80 %G   RBC 4.52 (*) 4.69 - 6.13 M/uL   Hemoglobin 14.2  14.1 - 18.1 g/dL   HCT, POC 56.2  13.0 - 53.7 %   MCV 98.5 (*) 80 - 97 fL   MCH, POC 31.4 (*) 27 - 31.2 pg   MCHC 13.9 (*) 31.8 - 35.4 g/dL   RDW, POC 86.5     Platelet Count, POC 236  142 - 424 K/uL   MPV 8.6  0 - 99.8 fL    Assessment & Plan:  Acute tonsillitis - Plan: POCT CBC, cefTRIAXone (ROCEPHIN) injection 1 g  Meds ordered this encounter  Medications  . cefTRIAXone (ROCEPHIN) injection 1 g    Sig:   . amoxicillin-clavulanate (AUGMENTIN) 875-125 MG per tablet    Sig: Take 1 tablet by mouth 2 (two) times daily.    Dispense:  20 tablet    Refill:  0   Cont lortab prn F/u if relapses--? ent next

## 2013-07-25 ENCOUNTER — Ambulatory Visit (INDEPENDENT_AMBULATORY_CARE_PROVIDER_SITE_OTHER): Payer: BC Managed Care – PPO | Admitting: Family Medicine

## 2013-07-25 ENCOUNTER — Ambulatory Visit: Payer: BC Managed Care – PPO

## 2013-07-25 ENCOUNTER — Encounter: Payer: Self-pay | Admitting: Family Medicine

## 2013-07-25 VITALS — BP 136/88 | HR 74 | Temp 97.5°F | Resp 18

## 2013-07-25 DIAGNOSIS — K0889 Other specified disorders of teeth and supporting structures: Secondary | ICD-10-CM

## 2013-07-25 DIAGNOSIS — R209 Unspecified disturbances of skin sensation: Secondary | ICD-10-CM

## 2013-07-25 DIAGNOSIS — R509 Fever, unspecified: Secondary | ICD-10-CM

## 2013-07-25 DIAGNOSIS — J029 Acute pharyngitis, unspecified: Secondary | ICD-10-CM

## 2013-07-25 DIAGNOSIS — J039 Acute tonsillitis, unspecified: Secondary | ICD-10-CM

## 2013-07-25 DIAGNOSIS — R51 Headache: Principal | ICD-10-CM

## 2013-07-25 DIAGNOSIS — R200 Anesthesia of skin: Secondary | ICD-10-CM

## 2013-07-25 DIAGNOSIS — R519 Headache, unspecified: Secondary | ICD-10-CM

## 2013-07-25 DIAGNOSIS — K089 Disorder of teeth and supporting structures, unspecified: Secondary | ICD-10-CM

## 2013-07-25 DIAGNOSIS — K122 Cellulitis and abscess of mouth: Secondary | ICD-10-CM

## 2013-07-25 MED ORDER — CARBAMAZEPINE ER 100 MG PO TB12: 100.0000 mg | ORAL_TABLET | Freq: Two times a day (BID) | ORAL | Status: DC

## 2013-07-25 MED ORDER — AMOXICILLIN 500 MG PO CAPS: 1000.0000 mg | ORAL_CAPSULE | Freq: Two times a day (BID) | ORAL | Status: DC

## 2013-07-25 NOTE — Progress Notes (Addendum)
This chart was scribed for Johnn HaiKurt Lauestein, MD by Joaquin MusicKristina Sanchez-Matthews, ED Scribe. This patient was seen in room Room/bed 6 and the patient's care was started at 3:30 PM. Subjective:    Patient ID: Mason Perez, male    DOB: 03-May-1985, 28 y.o.   MRN: [id number]  HPI Mason Perez is a 28 y.o. male who [facility] complaining of sudden sharp L sided dental and facial pain with associated facial swelling and numbness that began 3 hours ago today. Pt states he was eating a bagel and began having sharp L upper dental pain; states he then noticed facial pain and numbness. States sx have not improved. Denies drug use or abuse. Reports using OTC Ibuprofen and Orajel. Denies hx of dental pain, blurred or double vision.  Work: [employer]  Patient Active Problem List   Diagnosis Date Noted   Nicotine addiction 11/19/2012   Current outpatient prescriptions:amoxicillin (AMOXIL) 500 MG capsule, Take 2 capsules (1,000 mg total) by mouth 2 (two) times daily., Disp: 40 capsule, Rfl: 0;  amoxicillin-clavulanate (AUGMENTIN) 875-125 MG per tablet, Take 1 tablet by mouth 2 (two) times daily., Disp: 20 tablet, Rfl: 0 azithromycin (ZITHROMAX) 250 MG tablet, Take 1 tablet (250 mg total) by mouth daily. Take first 2 tablets together, then 1 every day until finished., Disp: 6 tablet, Rfl: 0;  HYDROcodone-acetaminophen (HYCET) 7.5-325 mg/15 ml solution, Take 5 mLs by mouth every 6 (six) hours as needed for pain (or cough)., Disp: 240 mL, Rfl: 0 HYDROcodone-homatropine (HYCODAN) 5-1.5 MG/5ML syrup, Take 5 mLs by mouth every 6 (six) hours as needed for cough., Disp: 120 mL, Rfl: 0;  ondansetron (ZOFRAN ODT) 4 MG disintegrating tablet, Take 1 tablet (4 mg total) by mouth every 8 (eight) hours as needed for nausea., Disp: 20 tablet, Rfl: 0 Current facility-administered medications:cefTRIAXone (ROCEPHIN) injection 1 g, 1 g, Intramuscular, Q24H, Jonita Albeehris W Guest, MD, 1 g at 11/10/12 1809;  cefTRIAXone  (ROCEPHIN) injection 1 g, 1 g, Intramuscular, Q24H, Tonye Pearsonobert P Doolittle, MD, 1 g at 11/19/12 1250  Review of Systems  HENT: Positive for dental problem and facial swelling.   Eyes: Negative for visual disturbance.  Neurological: Positive for facial asymmetry.   Objective:   Physical Exam  Nursing note and vitals reviewed. Constitutional: He is oriented to person, place, and time. He appears well-developed and well-nourished. No distress.  HENT:  Head: Normocephalic and atraumatic.  Right Ear: External ear normal.  Left Ear: External ear normal.  Eyes: Conjunctivae are normal. Pupils are equal, round, and reactive to light.  Neck: Normal range of motion. Neck supple.  Cardiovascular: Normal rate.   No murmur heard. Pulmonary/Chest: Effort normal.  Abdominal: Bowel sounds are normal.  Musculoskeletal: Normal range of motion.  Neurological: He is alert and oriented to person, place, and time. He has normal reflexes.  Skin: Skin is warm and dry. He is not diaphoretic.  Psychiatric:  Patient has a very unusual affect. He seems distant and has very poor eye contact. In addition he's very slow to respond to questions. He has a demeanor someone who is onheavy medication.   [facility] reading (PRIMARY) by  Dr. Milus GlazierLauenstein:  No acute abnormalities  BP 136/88   Pulse 74   Temp(Src) 97.5 F (36.4 C) (Oral)   Resp 18 Assessment & Plan:    Facial numbness - Plan: DG Mandible 1-3 Views, DG Skull 1-3 Views  Facial pain - Plan: DG Mandible 1-3 Views, DG Skull 1-3 Views, carbamazepine (TEGRETOL-XR) 100 MG 12  hr tablet  Pain, dental - Plan: amoxicillin (AMOXIL) 500 MG capsule  Acute pharyngitis  Fever  Uvulitis  Tonsillitis  Signed, [name], MD

## 2013-07-25 NOTE — Patient Instructions (Signed)
Trigeminal Neuralgia Trigeminal neuralgia is a nerve disorder that causes sudden attacks of severe facial pain. It is caused by damage to the trigeminal nerve, a major nerve in the face. It is more common in women and in the elderly, although it can also happen in younger patients. Attacks last from a few seconds to several minutes and can occur from a couple of times per year to several times per day. Trigeminal neuralgia can be a very distressing and disabling condition. Surgery may be needed in very severe cases if medical treatment does not give relief. HOME CARE INSTRUCTIONS   If your caregiver prescribed medication to help prevent attacks, take as directed.  To help prevent attacks:  Chew on the unaffected side of the mouth.  Avoid touching your face.  Avoid blasts of hot or cold air.  Men may wish to grow a beard to avoid having to shave. SEEK IMMEDIATE MEDICAL CARE IF:  Pain is unbearable and your medicine does not help.  You develop new, unexplained symptoms (problems).  You have problems that may be related to a medication you are taking. Document Released: 02/11/2000 Document Revised: 05/08/2011 Document Reviewed: 12/11/2008 [employer] Patient Information 2014 [employer].  

## 2013-11-05 ENCOUNTER — Ambulatory Visit: Payer: Self-pay

## 2014-02-25 ENCOUNTER — Emergency Department (HOSPITAL_BASED_OUTPATIENT_CLINIC_OR_DEPARTMENT_OTHER)
Admission: EM | Admit: 2014-02-25 | Discharge: 2014-02-25 | Disposition: A | Discharge status: Home / Self Care | Payer: BC Managed Care – PPO | Attending: Emergency Medicine | Admitting: Emergency Medicine

## 2014-02-25 ENCOUNTER — Encounter (HOSPITAL_BASED_OUTPATIENT_CLINIC_OR_DEPARTMENT_OTHER): Payer: Self-pay | Admitting: *Deleted

## 2014-02-25 DIAGNOSIS — Z792 Long term (current) use of antibiotics: Secondary | ICD-10-CM | POA: Insufficient documentation

## 2014-02-25 DIAGNOSIS — R6889 Other general symptoms and signs: Secondary | ICD-10-CM

## 2014-02-25 DIAGNOSIS — R5383 Other fatigue: Secondary | ICD-10-CM | POA: Insufficient documentation

## 2014-02-25 DIAGNOSIS — R112 Nausea with vomiting, unspecified: Secondary | ICD-10-CM | POA: Insufficient documentation

## 2014-02-25 DIAGNOSIS — M791 Myalgia: Secondary | ICD-10-CM | POA: Insufficient documentation

## 2014-02-25 DIAGNOSIS — Z720 Tobacco use: Secondary | ICD-10-CM | POA: Insufficient documentation

## 2014-02-25 DIAGNOSIS — Z79899 Other long term (current) drug therapy: Secondary | ICD-10-CM | POA: Insufficient documentation

## 2014-02-25 DIAGNOSIS — R231 Pallor: Secondary | ICD-10-CM | POA: Insufficient documentation

## 2014-02-25 DIAGNOSIS — R509 Fever, unspecified: Secondary | ICD-10-CM | POA: Insufficient documentation

## 2014-02-25 DIAGNOSIS — R51 Headache: Secondary | ICD-10-CM | POA: Insufficient documentation

## 2014-02-25 DIAGNOSIS — R61 Generalized hyperhidrosis: Secondary | ICD-10-CM | POA: Insufficient documentation

## 2014-02-25 DIAGNOSIS — R197 Diarrhea, unspecified: Secondary | ICD-10-CM | POA: Insufficient documentation

## 2014-02-25 MED ORDER — OSELTAMIVIR PHOSPHATE 75 MG PO CAPS: 75.0000 mg | ORAL_CAPSULE | Freq: Two times a day (BID) | ORAL | Status: DC

## 2014-02-25 MED ORDER — ACETAMINOPHEN 325 MG PO TABS
ORAL_TABLET | ORAL | Status: AC
  Filled 2014-02-25: qty 2

## 2014-02-25 MED ORDER — ACETAMINOPHEN 325 MG PO TABS
650.0000 mg | ORAL_TABLET | Freq: Once | ORAL | Status: AC
  Administered 2014-02-25: 650 mg via ORAL

## 2014-02-25 NOTE — ED Notes (Signed)
Pt reports body aches, chills, headache since last night- reports vomited x 4 today

## 2014-02-25 NOTE — Discharge Instructions (Signed)
Influenza Influenza (flu) is an infection in the mouth, nose, and throat (respiratory tract) caused by a virus. The flu can make you feel very ill. Influenza spreads easily from person to person (contagious).  HOME CARE   Only take medicines as told by your doctor.  Use a cool mist humidifier to make breathing easier.  Get plenty of rest until your fever goes away. This usually takes 3 to 4 days.  Drink enough fluids to keep your pee (urine) clear or pale yellow.  Cover your mouth and nose when you cough or sneeze.  Wash your hands well to avoid spreading the flu.  Stay home from work or school until your fever has been gone for at least 1 full day.  Get a flu shot every year. GET HELP RIGHT AWAY IF:   You have trouble breathing or feel short of breath.  Your skin or nails turn blue.  You have severe neck pain or stiffness.  You have a severe headache, facial pain, or earache.  Your fever gets worse or keeps coming back.  You feel sick to your stomach (nauseous), throw up (vomit), or have watery poop (diarrhea).  You have chest pain.  You have a deep cough that gets worse, or you cough up more thick spit (mucus). MAKE SURE YOU:   Understand these instructions.  Will watch your condition.  Will get help right away if you are not doing well or get worse. Document Released: 11/23/2007 Document Revised: 06/30/2013 Document Reviewed: 05/15/2011 Exit[employer] Patient Information 2015 [employer]. This information is not intended to replace advice given to you by your health care provider. Make sure you discuss any questions you have with your health care provider.  

## 2014-02-25 NOTE — ED Provider Notes (Signed)
CSN: [id number]     Arrival date & time 02/25/14  1947 History   First MD Initiated Contact with Patient 02/25/14 2224     Chief Complaint  Patient presents with  . Influenza     (Consider location/radiation/quality/duration/timing/severity/associated sxs/prior Treatment) HPI  Pt presents for fever, muscle aches, headaches starting last night. Patient reports that he has had some n/v/d and fever/chills/sweats but most troublesome is the body aches and fatigue. He endorses sore throat, denies cough and congestion.  History reviewed. No pertinent past medical history. Past Surgical History  Procedure Laterality Date  . Hernia repair     No family history on file. History  Substance Use Topics  . Smoking status: Current Every Day Smoker -- 0.50 packs/day    Types: Cigarettes  . Smokeless tobacco: Not on file  . Alcohol Use: No    Review of Systems See HPI    Allergies  Review of patient's allergies indicates no known allergies.  Home Medications   Prior to Admission medications   Medication Sig Start Date End Date Taking? Authorizing Provider  amoxicillin (AMOXIL) 500 MG capsule Take 2 capsules (1,000 mg total) by mouth 2 (two) times daily. 07/25/13   Elvina SidleKurt Lauenstein, MD  amoxicillin-clavulanate (AUGMENTIN) 875-125 MG per tablet Take 1 tablet by mouth 2 (two) times daily. 11/19/12   Tonye Pearsonobert P Doolittle, MD  azithromycin (ZITHROMAX) 250 MG tablet Take 1 tablet (250 mg total) by mouth daily. Take first 2 tablets together, then 1 every day until finished. 11/29/11   Carlisle BeersJohn L Molpus, MD  carbamazepine (TEGRETOL-XR) 100 MG 12 hr tablet Take 1 tablet (100 mg total) by mouth 2 (two) times daily. 07/25/13   Elvina SidleKurt Lauenstein, MD  HYDROcodone-acetaminophen (HYCET) 7.5-325 mg/15 ml solution Take 5 mLs by mouth every 6 (six) hours as needed for pain (or cough). 11/10/12   Jonita Albeehris W Guest, MD  HYDROcodone-homatropine [facility](HYCODAN) 5-1.5 MG/5ML syrup Take 5 mLs by mouth every 6 (six) hours as needed for  cough. 11/29/11   John L Molpus, MD  ondansetron (ZOFRAN ODT) 4 MG disintegrating tablet Take 1 tablet (4 mg total) by mouth every 8 (eight) hours as needed for nausea. 01/09/12   Teressa LowerVrinda Pickering, NP  oseltamivir (TAMIFLU) 75 MG capsule Take 1 capsule (75 mg total) by mouth 2 (two) times daily. 02/25/14   Abram SanderElena M Adamo, MD   BP 109/55 mmHg  Pulse 138  Temp(Src) 102.9 F (39.4 C) (Oral)  Resp 32  Ht 5\' 11"  (1.803 m)  Wt 210 lb (95.255 kg)  BMI 29.30 kg/m2  SpO2 98% Physical Exam  Constitutional: He is oriented to person, place, and time. He appears well-developed and well-nourished. No distress.  Sleeping  HENT:  Head: Normocephalic and atraumatic.  Nose: Nose normal.  Mouth/Throat: Uvula is midline and oropharynx is clear and moist. Mucous membranes are dry.  Eyes: Conjunctivae are normal. Right eye exhibits no discharge. Left eye exhibits no discharge. No scleral icterus.  Neck: Normal range of motion. Neck supple.  Cardiovascular: Normal rate, regular rhythm and intact distal pulses.  Exam reveals no gallop and no friction rub.   No murmur heard. Pulmonary/Chest: Effort normal and breath sounds normal. No respiratory distress. He has no wheezes.  Abdominal: Soft. Bowel sounds are normal. He exhibits no distension. There is no tenderness.  Lymphadenopathy:    He has no cervical adenopathy.  Neurological: He is alert and oriented to person, place, and time.  Skin: Skin is warm. No rash noted. He is diaphoretic. There  is pallor.  Nursing note and vitals reviewed.   ED Course  Procedures (including critical care time) Labs Review Labs Reviewed - No data to display  Imaging Review No results found.   EKG Interpretation None      MDM   Final diagnoses:  Flu-like symptoms   Flu like illness starting 24 hours ago. Rx tamiflu and rec rest, fluids, symptom management.    Abram SanderElena M Adamo, MD 02/25/14 [phone]  Nelia Shiobert L Beaton, MD 02/25/14 [phone]

## 2014-10-21 ENCOUNTER — Emergency Department (HOSPITAL_BASED_OUTPATIENT_CLINIC_OR_DEPARTMENT_OTHER)
Admission: EM | Admit: 2014-10-21 | Discharge: 2014-10-21 | Disposition: A | Discharge status: Home / Self Care | Payer: Self-pay | Attending: Emergency Medicine | Admitting: Emergency Medicine

## 2014-10-21 ENCOUNTER — Encounter (HOSPITAL_BASED_OUTPATIENT_CLINIC_OR_DEPARTMENT_OTHER): Payer: Self-pay

## 2014-10-21 ENCOUNTER — Emergency Department (HOSPITAL_BASED_OUTPATIENT_CLINIC_OR_DEPARTMENT_OTHER): Payer: Self-pay

## 2014-10-21 DIAGNOSIS — Y9389 Activity, other specified: Secondary | ICD-10-CM | POA: Insufficient documentation

## 2014-10-21 DIAGNOSIS — R1110 Vomiting, unspecified: Secondary | ICD-10-CM | POA: Insufficient documentation

## 2014-10-21 DIAGNOSIS — Y92410 Unspecified street and highway as the place of occurrence of the external cause: Secondary | ICD-10-CM | POA: Insufficient documentation

## 2014-10-21 DIAGNOSIS — Z792 Long term (current) use of antibiotics: Secondary | ICD-10-CM | POA: Insufficient documentation

## 2014-10-21 DIAGNOSIS — Y998 Other external cause status: Secondary | ICD-10-CM | POA: Insufficient documentation

## 2014-10-21 DIAGNOSIS — S0990XA Unspecified injury of head, initial encounter: Secondary | ICD-10-CM | POA: Insufficient documentation

## 2014-10-21 DIAGNOSIS — Z79899 Other long term (current) drug therapy: Secondary | ICD-10-CM | POA: Insufficient documentation

## 2014-10-21 DIAGNOSIS — Z720 Tobacco use: Secondary | ICD-10-CM | POA: Insufficient documentation

## 2014-10-21 MED ORDER — ACETAMINOPHEN 500 MG PO TABS
1000.0000 mg | ORAL_TABLET | Freq: Once | ORAL | Status: AC
  Administered 2014-10-21: 1000 mg via ORAL
  Filled 2014-10-21: qty 2

## 2014-10-21 NOTE — Discharge Instructions (Signed)
Ibuprofen 600 mg every 6 hours as needed for pain.  Return to the emergency department if symptoms significantly worsen or change.   Head Injury You have received a head injury. It does not appear serious at this time. Headaches and vomiting are common following head injury. It should be easy to awaken from sleeping. Sometimes it is necessary for you to stay in the emergency department for a while for observation. Sometimes admission to the hospital may be needed. After injuries such as yours, most problems occur within the first 24 hours, but side effects may occur up to 7-10 days after the injury. It is important for you to carefully monitor your condition and contact your health care provider or seek immediate medical care if there is a change in your condition. WHAT ARE THE TYPES OF HEAD INJURIES? Head injuries can be as minor as a bump. Some head injuries can be more severe. More severe head injuries include:  A jarring injury to the brain (concussion).  A bruise of the brain (contusion). This mean there is bleeding in the brain that can cause swelling.  A cracked skull (skull fracture).  Bleeding in the brain that collects, clots, and forms a bump (hematoma). WHAT CAUSES A HEAD INJURY? A serious head injury is most likely to happen to someone who is in a car wreck and is not wearing a seat belt. Other causes of major head injuries include bicycle or motorcycle accidents, sports injuries, and falls. HOW ARE HEAD INJURIES DIAGNOSED? A complete history of the event leading to the injury and your current symptoms will be helpful in diagnosing head injuries. Many times, pictures of the brain, such as CT or MRI are needed to see the extent of the injury. Often, an overnight hospital stay is necessary for observation.  WHEN SHOULD I SEEK IMMEDIATE MEDICAL CARE?  You should get help right away if:  You have confusion or drowsiness.  You feel sick to your stomach (nauseous) or have continued,  forceful vomiting.  You have dizziness or unsteadiness that is getting worse.  You have severe, continued headaches not relieved by medicine. Only take over-the-counter or prescription medicines for pain, fever, or discomfort as directed by your health care provider.  You do not have normal function of the arms or legs or are unable to walk.  You notice changes in the black spots in the center of the colored part of your eye (pupil).  You have a clear or bloody fluid coming from your nose or ears.  You have a loss of vision. During the next 24 hours after the injury, you must stay with someone who can watch you for the warning signs. This person should contact local emergency services (911 in the [place] if you have seizures, you become unconscious, or you are unable to wake up. HOW CAN I PREVENT A HEAD INJURY IN THE FUTURE? The most important factor for preventing major head injuries is avoiding motor vehicle accidents. To minimize the potential for damage to your head, it is crucial to wear seat belts while riding in motor vehicles. Wearing helmets while bike riding and playing collision sports (like football) is also helpful. Also, avoiding dangerous activities around the house will further help reduce your risk of head injury.  WHEN CAN I RETURN TO NORMAL ACTIVITIES AND ATHLETICS? You should be reevaluated by your health care provider before returning to these activities. If you have any of the following symptoms, you should not return to activities or contact  sports until 1 week after the symptoms have stopped:  Persistent headache.  Dizziness or vertigo.  Poor attention and concentration.  Confusion.  Memory problems.  Nausea or vomiting.  Fatigue or tire easily.  Irritability.  Intolerant of bright lights or loud noises.  Anxiety or depression.  Disturbed sleep. MAKE SURE YOU:   Understand these instructions.  Will watch your condition.  Will get help right away if  you are not doing well or get worse. Document Released: 02/13/2005 Document Revised: 02/18/2013 Document Reviewed: 10/21/2012 [facility] Patient Information 2015 [place], [place]. This information is not intended to replace advice given to you by your health care provider. Make sure you discuss any questions you have with your health care provider.  Motor Vehicle Collision It is common to have multiple bruises and sore muscles after a motor vehicle collision (MVC). These tend to feel worse for the first 24 hours. You may have the most stiffness and soreness over the first several hours. You may also feel worse when you wake up the first morning after your collision. After this point, you will usually begin to improve with each day. The speed of improvement often depends on the severity of the collision, the number of injuries, and the location and nature of these injuries. HOME CARE INSTRUCTIONS  Put ice on the injured area.  Put ice in a plastic bag.  Place a towel between your skin and the bag.  Leave the ice on for 15-20 minutes, 3-4 times a day, or as directed by your health care provider.  Drink enough fluids to keep your urine clear or pale yellow. Do not drink alcohol.  Take a warm shower or bath once or twice a day. This will increase blood flow to sore muscles.  You may return to activities as directed by your caregiver. Be careful when lifting, as this may aggravate neck or back pain.  Only take over-the-counter or prescription medicines for pain, discomfort, or fever as directed by your caregiver. Do not use aspirin. This may increase bruising and bleeding. SEEK IMMEDIATE MEDICAL CARE IF:  You have numbness, tingling, or weakness in the arms or legs.  You develop severe headaches not relieved with medicine.  You have severe neck pain, especially tenderness in the middle of the back of your neck.  You have changes in bowel or bladder control.  There is increasing pain in any  area of the body.  You have shortness of breath, light-headedness, dizziness, or fainting.  You have chest pain.  You feel sick to your stomach (nauseous), throw up (vomit), or sweat.  You have increasing abdominal discomfort.  There is blood in your urine, stool, or vomit.  You have pain in your shoulder (shoulder strap areas).  You feel your symptoms are getting worse. MAKE SURE YOU:  Understand these instructions.  Will watch your condition.  Will get help right away if you are not doing well or get worse. Document Released: 02/13/2005 Document Revised: 06/30/2013 Document Reviewed: 07/13/2010 [facility] Patient Information 2015 [place], [place]. This information is not intended to replace advice given to you by your health care provider. Make sure you discuss any questions you have with your health care provider.
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
## 2014-12-08 ENCOUNTER — Encounter (HOSPITAL_COMMUNITY): Payer: Self-pay | Admitting: *Deleted

## 2014-12-08 ENCOUNTER — Emergency Department (HOSPITAL_COMMUNITY): Payer: Self-pay

## 2014-12-08 ENCOUNTER — Emergency Department (HOSPITAL_COMMUNITY)
Admission: EM | Admit: 2014-12-08 | Discharge: 2014-12-08 | Disposition: A | Discharge status: Home / Self Care | Payer: Self-pay | Attending: Emergency Medicine | Admitting: Emergency Medicine

## 2014-12-08 DIAGNOSIS — Y9289 Other specified places as the place of occurrence of the external cause: Secondary | ICD-10-CM | POA: Insufficient documentation

## 2014-12-08 DIAGNOSIS — Y998 Other external cause status: Secondary | ICD-10-CM | POA: Insufficient documentation

## 2014-12-08 DIAGNOSIS — Y9389 Activity, other specified: Secondary | ICD-10-CM | POA: Insufficient documentation

## 2014-12-08 DIAGNOSIS — S299XXA Unspecified injury of thorax, initial encounter: Secondary | ICD-10-CM | POA: Insufficient documentation

## 2014-12-08 DIAGNOSIS — Z720 Tobacco use: Secondary | ICD-10-CM | POA: Insufficient documentation

## 2014-12-08 DIAGNOSIS — Z8719 Personal history of other diseases of the digestive system: Secondary | ICD-10-CM | POA: Insufficient documentation

## 2014-12-08 DIAGNOSIS — Z79899 Other long term (current) drug therapy: Secondary | ICD-10-CM | POA: Insufficient documentation

## 2014-12-08 DIAGNOSIS — W19XXXA Unspecified fall, initial encounter: Secondary | ICD-10-CM

## 2014-12-08 DIAGNOSIS — S4992XA Unspecified injury of left shoulder and upper arm, initial encounter: Secondary | ICD-10-CM | POA: Insufficient documentation

## 2014-12-08 DIAGNOSIS — R0789 Other chest pain: Secondary | ICD-10-CM

## 2014-12-08 DIAGNOSIS — Z792 Long term (current) use of antibiotics: Secondary | ICD-10-CM | POA: Insufficient documentation

## 2014-12-08 DIAGNOSIS — W010XXA Fall on same level from slipping, tripping and stumbling without subsequent striking against object, initial encounter: Secondary | ICD-10-CM | POA: Insufficient documentation

## 2014-12-08 MED ORDER — HYDROCODONE-ACETAMINOPHEN 5-325 MG PO TABS
2.0000 | ORAL_TABLET | Freq: Once | ORAL | Status: AC
  Administered 2014-12-08: 2 via ORAL
  Filled 2014-12-08: qty 2

## 2014-12-08 MED ORDER — IBUPROFEN 800 MG PO TABS
800.0000 mg | ORAL_TABLET | Freq: Once | ORAL | Status: AC
  Administered 2014-12-08: 800 mg via ORAL
  Filled 2014-12-08: qty 1

## 2014-12-08 MED ORDER — HYDROCODONE-ACETAMINOPHEN 5-325 MG PO TABS: 1.0000 | ORAL_TABLET | ORAL | Status: DC | PRN

## 2014-12-08 MED ORDER — IBUPROFEN 800 MG PO TABS: 800.0000 mg | ORAL_TABLET | Freq: Three times a day (TID) | ORAL | Status: DC

## 2014-12-08 NOTE — ED Notes (Signed)
Pt states that he slipped yesterday and fell and landed on left shoulder; pt c/o left shoulder pain radiating to left arm; pt c/o left upper chest muscular pain worse when moves arm; pt states that the pain has gotten progressively worse; pt states that he is unable to raise his arm above the level of his shoulder due to pain; + pulses' + sensation; no swelling or deformity noted; pt also c/o left rib pain and tender to palpation; pt denies shortness of breath

## 2014-12-08 NOTE — Discharge Instructions (Signed)
Chest Wall Pain °Chest wall pain is pain in or around the bones and muscles of your chest. Sometimes, an injury causes this pain. Sometimes, the cause may not be known. This pain may take several weeks or longer to get better. °HOME CARE INSTRUCTIONS  °Pay attention to any changes in your symptoms. Take these actions to help with your pain:  °· Rest as told by your health care provider.   °· Avoid activities that cause pain. These include any activities that use your chest muscles or your abdominal and side muscles to lift heavy items.    °· If directed, apply ice to the painful area: °· Put ice in a plastic bag. °· Place a towel between your skin and the bag. °· Leave the ice on for 20 minutes, 2-3 times per day. °· Take over-the-counter and prescription medicines only as told by your health care provider. °· Do not use tobacco products, including cigarettes, chewing tobacco, and e-cigarettes. If you need help quitting, ask your health care provider. °· Keep all follow-up visits as told by your health care provider. This is important. °SEEK MEDICAL CARE IF: °· You have a fever. °· Your chest pain becomes worse. °· You have new symptoms. °SEEK IMMEDIATE MEDICAL CARE IF: °· You have nausea or vomiting. °· You feel sweaty or light-headed. °· You have a cough with phlegm (sputum) or you cough up blood. °· You develop shortness of breath. °  °This information is not intended to replace advice given to you by your health care provider. Make sure you discuss any questions you have with your health care provider. °  °Document Released: 02/13/2005 Document Revised: 11/04/2014 Document Reviewed: 05/11/2014 °Elsevier Interactive Patient Education ©2016 [employer] Inc[employer] °Heat Therapy °Heat therapy can help ease sore, stiff, injured, and tight muscles and joints. Heat relaxes your muscles, which may help ease your pain.  °RISKS AND COMPLICATIONS °If you have any of the following conditions, do not use heat therapy unless your  health care provider has approved: °· Poor circulation. °· Healing wounds or scarred skin in the area being treated. °· Diabetes, heart disease, or high blood pressure. °· Not being able to feel (numbness) the area being treated. °· Unusual swelling of the area being treated. °· Active infections. °· Blood clots. °· Cancer. °· Inability to communicate pain. This may include young children and people who have problems with their brain function (dementia). °· Pregnancy. °Heat therapy should only be used on old, pre-existing, or long-lasting (chronic) injuries. Do not use heat therapy on new injuries unless directed by your health care provider. °HOW TO USE HEAT THERAPY °There are several different kinds of heat therapy, including: °· Moist heat pack. °· Warm water bath. °· Hot water bottle. °· Electric heating pad. °· Heated gel pack. °· Heated wrap. °· Electric heating pad. °Use the heat therapy method suggested by your health care provider. Follow your health care provider's instructions on when and how to use heat therapy. °GENERAL HEAT THERAPY RECOMMENDATIONS °· Do not sleep while using heat therapy. Only use heat therapy while you are awake. °· Your skin may turn pink while using heat therapy. Do not use heat therapy if your skin turns red. °· Do not use heat therapy if you have new pain. °· High heat or long exposure to heat can cause burns. Be careful when using heat therapy to avoid burning your skin. °· Do not use heat therapy on areas of your skin that are already irritated, such as with a   rash or sunburn. °SEEK MEDICAL CARE IF: °· You have blisters, redness, swelling, or numbness. °· You have new pain. °· Your pain is worse. °MAKE SURE YOU: °· Understand these instructions. °· Will watch your condition. °· Will get help right away if you are not doing well or get worse. °  °This information is not intended to replace advice given to you by your health care provider. Make sure you discuss any questions you  have with your health care provider. °  °Document Released: 05/08/2011 Document Revised: 03/06/2014 Document Reviewed: 04/08/2013 °Elsevier Interactive Patient Education ©2016 Elsevier Inc. ° °
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
## 2015-02-20 ENCOUNTER — Emergency Department (HOSPITAL_BASED_OUTPATIENT_CLINIC_OR_DEPARTMENT_OTHER): Payer: Self-pay

## 2015-02-20 ENCOUNTER — Emergency Department (HOSPITAL_BASED_OUTPATIENT_CLINIC_OR_DEPARTMENT_OTHER)
Admission: EM | Admit: 2015-02-20 | Discharge: 2015-02-20 | Disposition: A | Discharge status: Home / Self Care | Payer: Self-pay | Attending: Emergency Medicine | Admitting: Emergency Medicine

## 2015-02-20 ENCOUNTER — Encounter (HOSPITAL_BASED_OUTPATIENT_CLINIC_OR_DEPARTMENT_OTHER): Payer: Self-pay | Admitting: *Deleted

## 2015-02-20 DIAGNOSIS — R197 Diarrhea, unspecified: Secondary | ICD-10-CM | POA: Insufficient documentation

## 2015-02-20 DIAGNOSIS — F17210 Nicotine dependence, cigarettes, uncomplicated: Secondary | ICD-10-CM | POA: Insufficient documentation

## 2015-02-20 DIAGNOSIS — R1110 Vomiting, unspecified: Secondary | ICD-10-CM | POA: Insufficient documentation

## 2015-02-20 DIAGNOSIS — R1032 Left lower quadrant pain: Secondary | ICD-10-CM | POA: Insufficient documentation

## 2015-02-20 DIAGNOSIS — Z791 Long term (current) use of non-steroidal anti-inflammatories (NSAID): Secondary | ICD-10-CM | POA: Insufficient documentation

## 2015-02-20 LAB — COMPREHENSIVE METABOLIC PANEL
ALBUMIN: 4.2 g/dL (ref 3.5–5.0)
ALK PHOS: 65 U/L (ref 38–126)
ALT: 25 U/L (ref 17–63)
AST: 23 U/L (ref 15–41)
Anion gap: 6 (ref 5–15)
BILIRUBIN TOTAL: 0.7 mg/dL (ref 0.3–1.2)
BUN: 10 mg/dL (ref 6–20)
CHLORIDE: 105 mmol/L (ref 101–111)
CO2: 28 mmol/L (ref 22–32)
Calcium: 9.1 mg/dL (ref 8.9–10.3)
Creatinine, Ser: 1.03 mg/dL (ref 0.61–1.24)
Glucose, Bld: 129 mg/dL — ABNORMAL HIGH (ref 65–99)
Potassium: 3.7 mmol/L (ref 3.5–5.1)
Sodium: 139 mmol/L (ref 135–145)
TOTAL PROTEIN: 7.5 g/dL (ref 6.5–8.1)

## 2015-02-20 LAB — CBC WITH DIFFERENTIAL/PLATELET
Basophils Absolute: 0 10*3/uL (ref 0.0–0.1)
Basophils Relative: 0 %
EOS ABS: 0.1 10*3/uL (ref 0.0–0.7)
Eosinophils Relative: 1 %
HEMATOCRIT: 46 % (ref 39.0–52.0)
HEMOGLOBIN: 15.5 g/dL (ref 13.0–17.0)
LYMPHS ABS: 2.2 10*3/uL (ref 0.7–4.0)
Lymphocytes Relative: 22 %
MCH: 31.1 pg (ref 26.0–34.0)
MCHC: 33.7 g/dL (ref 30.0–36.0)
MCV: 92.2 fL (ref 78.0–100.0)
MONOS PCT: 7 %
Monocytes Absolute: 0.7 10*3/uL (ref 0.1–1.0)
NEUTROS ABS: 6.7 10*3/uL (ref 1.7–7.7)
Neutrophils Relative %: 70 %
Platelets: 233 10*3/uL (ref 150–400)
RBC: 4.99 MIL/uL (ref 4.22–5.81)
RDW: 12.5 % (ref 11.5–15.5)
WBC: 9.7 10*3/uL (ref 4.0–10.5)

## 2015-02-20 MED ORDER — MORPHINE SULFATE (PF) 4 MG/ML IV SOLN
4.0000 mg | Freq: Once | INTRAVENOUS | Status: AC
  Administered 2015-02-20: 4 mg via INTRAVENOUS
  Filled 2015-02-20: qty 1

## 2015-02-20 MED ORDER — IOHEXOL 300 MG/ML  SOLN
50.0000 mL | Freq: Once | INTRAMUSCULAR | Status: AC | PRN
  Administered 2015-02-20: 50 mL via ORAL

## 2015-02-20 MED ORDER — PROMETHAZINE HCL 25 MG/ML IJ SOLN
12.5000 mg | Freq: Once | INTRAMUSCULAR | Status: AC
  Administered 2015-02-20: 12.5 mg via INTRAVENOUS
  Filled 2015-02-20: qty 1

## 2015-02-20 MED ORDER — HYDROCODONE-ACETAMINOPHEN 5-325 MG PO TABS: 2.0000 | ORAL_TABLET | ORAL | Status: DC | PRN

## 2015-02-20 MED ORDER — PROMETHAZINE HCL 25 MG PO TABS: 25.0000 mg | ORAL_TABLET | Freq: Four times a day (QID) | ORAL | Status: DC | PRN

## 2015-02-20 MED ORDER — SODIUM CHLORIDE 0.9 % IV BOLUS (SEPSIS)
1000.0000 mL | Freq: Once | INTRAVENOUS | Status: AC
  Administered 2015-02-20: 1000 mL via INTRAVENOUS

## 2015-02-20 MED ORDER — IOHEXOL 300 MG/ML  SOLN
100.0000 mL | Freq: Once | INTRAMUSCULAR | Status: AC | PRN
  Administered 2015-02-20: 100 mL via INTRAVENOUS

## 2015-02-20 MED ORDER — ONDANSETRON HCL 4 MG/2ML IJ SOLN
4.0000 mg | Freq: Once | INTRAMUSCULAR | Status: AC
  Administered 2015-02-20: 4 mg via INTRAVENOUS
  Filled 2015-02-20: qty 2

## 2015-02-20 NOTE — ED Notes (Signed)
Patient transported to CT 

## 2015-02-20 NOTE — ED Notes (Signed)
Pt up to the RR. Pt unable to void at this time.

## 2015-02-20 NOTE — Discharge Instructions (Signed)
Mr. Mason Perez,  Nice meeting you! Please follow-up with your primary care provider as needed. Return to the emergency department if you develop fevers, chills, increased abdominal pain, or are unable to keep foods down. Feel better soon!  S. Lane HackerNicole Chyan Carnero, PA-C   Abdominal Pain, Adult Many things can cause abdominal pain. Usually, abdominal pain is not caused by a disease and will improve without treatment. It can often be observed and treated at home. Your health care provider will do a physical exam and possibly order blood tests and X-rays to help determine the seriousness of your pain. However, in many cases, more time must pass before a clear cause of the pain can be found. Before that point, your health care provider may not know if you need more testing or further treatment. HOME CARE INSTRUCTIONS Monitor your abdominal pain for any changes. The following actions may help to alleviate any discomfort you are experiencing:  Only take over-the-counter or prescription medicines as directed by your health care provider.  Do not take laxatives unless directed to do so by your health care provider.  Try a clear liquid diet (broth, tea, or water) as directed by your health care provider. Slowly move to a bland diet as tolerated. SEEK MEDICAL CARE IF:  You have unexplained abdominal pain.  You have abdominal pain associated with nausea or diarrhea.  You have pain when you urinate or have a bowel movement.  You experience abdominal pain that wakes you in the night.  You have abdominal pain that is worsened or improved by eating food.  You have abdominal pain that is worsened with eating fatty foods.  You have a fever. SEEK IMMEDIATE MEDICAL CARE IF:  Your pain does not go away within 2 hours.  You keep throwing up (vomiting).  Your pain is felt only in portions of the abdomen, such as the right side or the left lower portion of the abdomen.  You pass bloody or black tarry  stools. MAKE SURE YOU:  Understand these instructions.  Will watch your condition.  Will get help right away if you are not doing well or get worse.   This information is not intended to replace advice given to you by your health care provider. Make sure you discuss any questions you have with your health care provider.   Document Released: 11/23/2004 Document Revised: 11/04/2014 Document Reviewed: 10/23/2012 Elsevier Interactive Patient Education [employer]2016 Elsevier Inc.

## 2015-02-20 NOTE — ED Notes (Signed)
Pt drinking drink from xray, still unable to urinate.

## 2015-02-20 NOTE — ED Notes (Signed)
Pt still unable to void

## 2015-02-20 NOTE — ED Notes (Signed)
Patient c/o vomiting and diarrhea for the past two days, no meds taken

## 2015-02-20 NOTE — ED Notes (Signed)
Family has arrived to pick up patient.

## 2015-02-20 NOTE — ED Notes (Signed)
Patient waiting for family member to arrive. Patient has had narcotics.

## 2015-03-04 NOTE — ED Provider Notes (Signed)
CSN: [id number]     Arrival date & time 02/20/15  1006 History   First MD Initiated Contact with Patient 02/20/15 1200     Chief Complaint  Patient presents with  . Emesis   HPI   Rae Sutcliffe is a 30 y.o. M PMH significant for hernia repair presenting with a 2 day history of persistent vomiting and diarrhea. He also endorses LLQ abdominal pain (non-radiating, 8/10 pain scale, achy, intermittent). He denies alleviation attempts, association with BM or eating, exacerbating factors, recent abx use, ill contacts.  History reviewed. No pertinent past medical history. Past Surgical History  Procedure Laterality Date  . Hernia repair     No family history on file. Social History  Substance Use Topics  . Smoking status: Current Every Day Smoker -- 0.50 packs/day    Types: Cigarettes  . Smokeless tobacco: None  . Alcohol Use: No    Review of Systems  Ten systems are reviewed and are negative for acute change except as noted in the HPI  Allergies  Review of patient's allergies indicates no known allergies.  Home Medications   Prior to Admission medications   Medication Sig Start Date End Date Taking? Authorizing Provider  carbamazepine (TEGRETOL-XR) 100 MG 12 hr tablet Take 1 tablet (100 mg total) by mouth 2 (two) times daily. Patient not taking: Reported on 12/08/2014 07/25/13   [name], MD  HYDROcodone-acetaminophen (NORCO/VICODIN) 5-325 MG tablet Take 2 tablets by mouth every 4 (four) hours as needed. 02/20/15   Melton Krebs, PA-C  ibuprofen (ADVIL,MOTRIN) 800 MG tablet Take 1 tablet (800 mg total) by mouth 3 (three) times daily. 12/08/14   Elpidio Anis, PA-C  promethazine (PHENERGAN) 25 MG tablet Take 1 tablet (25 mg total) by mouth every 6 (six) hours as needed for nausea or vomiting. 02/20/15   Melton Krebs, PA-C   BP 123/84 mmHg  Pulse 68  Temp(Src) 98.1 F (36.7 C) (Oral)  Resp 16  SpO2 98% Physical Exam  Constitutional: He appears  well-developed and well-nourished. No distress.  HENT:  Head: Normocephalic and atraumatic.  Right Ear: External ear normal.  Left Ear: External ear normal.  Nose: Nose normal.  Mouth/Throat: Oropharynx is clear and moist. No oropharyngeal exudate.  Eyes: Conjunctivae are normal. Pupils are equal, round, and reactive to light. Right eye exhibits no discharge. Left eye exhibits no discharge. No scleral icterus.  Neck: No tracheal deviation present.  Cardiovascular: Normal rate, regular rhythm, normal heart sounds and intact distal pulses.  Exam reveals no gallop and no friction rub.   No murmur heard. Pulmonary/Chest: Effort normal and breath sounds normal. No respiratory distress. He has no wheezes. He has no rales. He exhibits no tenderness.  Abdominal: Soft. Bowel sounds are normal. He exhibits no distension and no mass. There is tenderness. There is no rebound and no guarding.  LLQ tenderness  Musculoskeletal: He exhibits no edema.  Lymphadenopathy:    He has no cervical adenopathy.  Neurological: He is alert. Coordination normal.  Skin: Skin is warm and dry. No rash noted. He is not diaphoretic. No erythema.  Psychiatric: He has a normal mood and affect. His behavior is normal.  Nursing note and vitals reviewed.   ED Course  Procedures (including critical care time) Labs Review Labs Reviewed  COMPREHENSIVE METABOLIC PANEL - Abnormal; Notable for the following:    Glucose, Bld 129 (*)    All other components within normal limits  CBC WITH DIFFERENTIAL/PLATELET    Imaging Review Ct  Abdomen Pelvis W Contrast  02/20/2015  CLINICAL DATA:  30 year old male with left lower quadrant abdominal pain accompanied by nausea, vomiting and diarrhea EXAM: CT ABDOMEN AND PELVIS WITH CONTRAST TECHNIQUE: Multidetector CT imaging of the abdomen and pelvis was performed using the standard protocol following bolus administration of intravenous contrast. CONTRAST:  50mL OMNIPAQUE IOHEXOL 300 MG/ML  SOLN, 100mL OMNIPAQUE IOHEXOL 300 MG/ML SOLN COMPARISON:  Prior CT abdomen/ pelvis 11/12/2009 FINDINGS: Lower Chest: Trace dependent atelectasis in the lower lobes. Nodular opacity affiliated with the minor fissure remains unchanged dating back to September of 2011 and is therefore benign. The visualized cardiac structures are within normal limits for size. No pericardial effusion. Unremarkable visualized distal thoracic esophagus. Abdomen: Unremarkable CT appearance of the stomach, duodenum, spleen, adrenal glands and pancreas. Normal hepatic contour and morphology. No discrete hepatic lesion. Gallbladder is unremarkable. No intra or extrahepatic biliary ductal dilatation. 6 mm nonobstructing stone in the lower pole collecting system on the right. This is enlarged compared to 3 mm in September of 2011. No significant left-sided nephrolithiasis. 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 No hydronephrosis or enhancing renal lesion. No evidence of obstruction or focal bowel wall thickening. Normal appendix in the right lower quadrant. The terminal ileum is unremarkable. There is a segment of the sigmoid colon which is slightly underdistended. However, were the internal gas contacts the outer wall, the wall is thin and without evidence of submucosal edema. Additionally, there is no evidence of inflammatory stranding in the perisigmoid fat. No free fluid or suspicious adenopathy. Pelvis: Unremarkable bladder, prostate gland and seminal vesicles. No free fluid or suspicious adenopathy. Bones/Soft Tissues: No acute fracture or aggressive appearing lytic or blastic osseous lesion. Vascular: No significant atherosclerotic vascular disease, aneurysmal dilatation or acute abnormality. Bilateral accessory renal arteries noted incidentally. IMPRESSION: 1. No acute abnormality in the abdomen or pelvis to explain the patient's clinical symptoms. 2. Nonobstructing 5 mm stone in the right lower pole collecting system.  Electronically Signed   By: Malachy MoanHeath  McCullough M.D.   On: 02/20/2015 14:46 I have personally reviewed and evaluated these images and lab results as part of my medical decision-making.   MDM   Final diagnoses:  LLQ pain   Patient non-toxic appearing and VSS. Based on patient history and physical exam, most likely etiologies are diverticulitis vs gastroenteritis. Less likely etiologies are kidney stone, mass.  CBC unremarkable. CMP with hyperglycemia of 129.  CT without abnormality to explain patient's symptoms but incidental finding of 5 mm nonobstructing kidney stone in right lower pole collecting system.  Patient feeling nauseous after zofran. Will give phenergan and reassess.  Patient feeling better after phenergan, morphine and fluids. Tolerating PO fluids. Patient may be safely discharged home. Discussed reasons for return. Patient to follow-up with primary care provider within one week. Patient in understanding and agreement with the plan.   Melton KrebsSamantha Nicole Shalee Paolo, PA-C 03/04/15 2046  Vanetta MuldersScott Zackowski, MD 03/18/15 1242

## 2015-04-23 ENCOUNTER — Emergency Department (HOSPITAL_BASED_OUTPATIENT_CLINIC_OR_DEPARTMENT_OTHER): Payer: Self-pay

## 2015-04-23 ENCOUNTER — Emergency Department (HOSPITAL_BASED_OUTPATIENT_CLINIC_OR_DEPARTMENT_OTHER)
Admission: EM | Admit: 2015-04-23 | Discharge: 2015-04-23 | Disposition: A | Discharge status: Home / Self Care | Payer: Self-pay | Attending: Emergency Medicine | Admitting: Emergency Medicine

## 2015-04-23 ENCOUNTER — Encounter (HOSPITAL_BASED_OUTPATIENT_CLINIC_OR_DEPARTMENT_OTHER): Payer: Self-pay | Admitting: Emergency Medicine

## 2015-04-23 DIAGNOSIS — M25521 Pain in right elbow: Secondary | ICD-10-CM

## 2015-04-23 DIAGNOSIS — S59901A Unspecified injury of right elbow, initial encounter: Secondary | ICD-10-CM | POA: Insufficient documentation

## 2015-04-23 DIAGNOSIS — Y9289 Other specified places as the place of occurrence of the external cause: Secondary | ICD-10-CM | POA: Insufficient documentation

## 2015-04-23 DIAGNOSIS — Z791 Long term (current) use of non-steroidal anti-inflammatories (NSAID): Secondary | ICD-10-CM | POA: Insufficient documentation

## 2015-04-23 DIAGNOSIS — Y9389 Activity, other specified: Secondary | ICD-10-CM | POA: Insufficient documentation

## 2015-04-23 DIAGNOSIS — Y998 Other external cause status: Secondary | ICD-10-CM | POA: Insufficient documentation

## 2015-04-23 DIAGNOSIS — F17210 Nicotine dependence, cigarettes, uncomplicated: Secondary | ICD-10-CM | POA: Insufficient documentation

## 2015-04-23 MED ORDER — HYDROCODONE-ACETAMINOPHEN 5-325 MG PO TABS
2.0000 | ORAL_TABLET | Freq: Once | ORAL | Status: AC
  Administered 2015-04-23: 2 via ORAL
  Filled 2015-04-23: qty 2

## 2015-04-23 MED ORDER — IBUPROFEN 800 MG PO TABS: 800.0000 mg | ORAL_TABLET | Freq: Three times a day (TID) | ORAL | Status: DC

## 2015-04-23 MED ORDER — HYDROCODONE-ACETAMINOPHEN 5-325 MG PO TABS: 1.0000 | ORAL_TABLET | Freq: Four times a day (QID) | ORAL | Status: DC | PRN
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
## 2015-04-23 NOTE — Discharge Instructions (Signed)
You likely do have a small break in your right elbow that is the source of your pain. Please take Motrin for mild to moderate pain in your hydrocodone for severe pain. Follow-up with orthopedics next week if your symptoms persist. Return to ED for new or worsening symptoms as we discussed.

## 2018-01-29 ENCOUNTER — Emergency Department (HOSPITAL_COMMUNITY)
Admission: EM | Admit: 2018-01-29 | Discharge: 2018-01-29 | Disposition: A | Discharge status: Home / Self Care | Payer: No Typology Code available for payment source | Attending: Emergency Medicine | Admitting: Emergency Medicine

## 2018-01-29 ENCOUNTER — Emergency Department (HOSPITAL_COMMUNITY): Payer: No Typology Code available for payment source

## 2018-01-29 ENCOUNTER — Other Ambulatory Visit: Payer: Self-pay

## 2018-01-29 ENCOUNTER — Encounter (HOSPITAL_COMMUNITY): Payer: Self-pay | Admitting: *Deleted

## 2018-01-29 DIAGNOSIS — F17210 Nicotine dependence, cigarettes, uncomplicated: Secondary | ICD-10-CM | POA: Insufficient documentation

## 2018-01-29 DIAGNOSIS — Y92410 Unspecified street and highway as the place of occurrence of the external cause: Secondary | ICD-10-CM | POA: Insufficient documentation

## 2018-01-29 DIAGNOSIS — Y9389 Activity, other specified: Secondary | ICD-10-CM | POA: Diagnosis not present

## 2018-01-29 DIAGNOSIS — Y999 Unspecified external cause status: Secondary | ICD-10-CM | POA: Diagnosis not present

## 2018-01-29 DIAGNOSIS — S0993XA Unspecified injury of face, initial encounter: Secondary | ICD-10-CM

## 2018-01-29 DIAGNOSIS — S0081XA Abrasion of other part of head, initial encounter: Secondary | ICD-10-CM | POA: Insufficient documentation

## 2018-01-29 MED ORDER — CYCLOBENZAPRINE HCL 10 MG PO TABS: 10.0000 mg | ORAL_TABLET | Freq: Three times a day (TID) | ORAL | 0 refills | Status: DC | PRN

## 2018-01-29 MED ORDER — HYDROCODONE-ACETAMINOPHEN 5-325 MG PO TABS: 1.0000 | ORAL_TABLET | Freq: Four times a day (QID) | ORAL | 0 refills | Status: DC | PRN
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
## 2019-03-31 ENCOUNTER — Ambulatory Visit (INDEPENDENT_AMBULATORY_CARE_PROVIDER_SITE_OTHER): Payer: PRIVATE HEALTH INSURANCE | Admitting: Family Medicine

## 2019-03-31 ENCOUNTER — Other Ambulatory Visit: Payer: Self-pay

## 2019-03-31 ENCOUNTER — Ambulatory Visit: Payer: Self-pay | Admitting: Family Medicine

## 2019-03-31 ENCOUNTER — Encounter: Payer: Self-pay | Admitting: Family Medicine

## 2019-03-31 VITALS — BP 118/84 | HR 89 | Temp 97.4°F | Ht 71.0 in | Wt 229.8 lb

## 2019-03-31 DIAGNOSIS — Z23 Encounter for immunization: Secondary | ICD-10-CM | POA: Diagnosis not present

## 2019-03-31 DIAGNOSIS — Z0000 Encounter for general adult medical examination without abnormal findings: Secondary | ICD-10-CM | POA: Diagnosis not present

## 2019-03-31 LAB — CBC
HCT: 44.4 % (ref 39.0–52.0)
Hemoglobin: 15.1 g/dL (ref 13.0–17.0)
MCHC: 34.1 g/dL (ref 30.0–36.0)
MCV: 95.2 fl (ref 78.0–100.0)
Platelets: 246 10*3/uL (ref 150.0–400.0)
RBC: 4.67 Mil/uL (ref 4.22–5.81)
RDW: 12.9 % (ref 11.5–15.5)
WBC: 6.6 10*3/uL (ref 4.0–10.5)

## 2019-03-31 LAB — COMPREHENSIVE METABOLIC PANEL
ALT: 26 U/L (ref 0–53)
AST: 20 U/L (ref 0–37)
Albumin: 4.3 g/dL (ref 3.5–5.2)
Alkaline Phosphatase: 61 U/L (ref 39–117)
BUN: 10 mg/dL (ref 6–23)
CO2: 28 mEq/L (ref 19–32)
Calcium: 9.1 mg/dL (ref 8.4–10.5)
Chloride: 102 mEq/L (ref 96–112)
Creatinine, Ser: 0.86 mg/dL (ref 0.40–1.50)
GFR: 101.14 mL/min (ref >60.00)
Glucose, Bld: 78 mg/dL (ref 70–99)
Potassium: 3.9 mEq/L (ref 3.5–5.1)
Sodium: 138 mEq/L (ref 135–145)
Total Bilirubin: 0.7 mg/dL (ref 0.2–1.2)
Total Protein: 7 g/dL (ref 6.0–8.3)

## 2019-03-31 LAB — TSH: TSH: 1.99 u[IU]/mL (ref 0.35–4.50)

## 2019-03-31 LAB — HEMOGLOBIN A1C: Hgb A1c MFr Bld: 5.7 % (ref 4.6–6.5)

## 2019-03-31 LAB — LIPID PANEL
Cholesterol: 190 mg/dL (ref 0–200)
HDL: 31.3 mg/dL — ABNORMAL LOW (ref >39.00)
LDL Cholesterol: 128 mg/dL — ABNORMAL HIGH (ref 0–99)
NonHDL: 159.14
Total CHOL/HDL Ratio: 6
Triglycerides: 155 mg/dL — ABNORMAL HIGH (ref 0.0–149.0)
VLDL: 31 mg/dL (ref 0.0–40.0)

## 2019-03-31 LAB — URINALYSIS, ROUTINE W REFLEX MICROSCOPIC
Bilirubin Urine: NEGATIVE
Hgb urine dipstick: NEGATIVE
Ketones, ur: NEGATIVE
Leukocytes,Ua: NEGATIVE
Nitrite: NEGATIVE
Specific Gravity, Urine: 1.015 (ref 1.000–1.030)
Total Protein, Urine: NEGATIVE
Urine Glucose: NEGATIVE
Urobilinogen, UA: 1 (ref 0.0–1.0)
pH: 7.5 (ref 5.0–8.0)
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
## 2019-03-31 NOTE — Patient Instructions (Signed)
Preventive Care 19-36 Years Old, Male Preventive care refers to lifestyle choices and visits with your health care provider that can promote health and wellness. This includes:  A yearly physical exam. This is also called an annual well check.  Regular dental and eye exams.  Immunizations.  Screening for certain conditions.  Healthy lifestyle choices, such as eating a healthy diet, getting regular exercise, not using drugs or products that contain nicotine and tobacco, and limiting alcohol use. What can I expect for my preventive care visit? Physical exam Your health care provider will check:  Height and weight. These may be used to calculate body mass index (BMI), which is a measurement that tells if you are at a healthy weight.  Heart rate and blood pressure.  Your skin for abnormal spots. Counseling Your health care provider may ask you questions about:  Alcohol, tobacco, and drug use.  Emotional well-being.  Home and relationship well-being.  Sexual activity.  Eating habits.  Work and work Statistician. What immunizations do I need?  Influenza (flu) vaccine  This is recommended every year. Tetanus, diphtheria, and pertussis (Tdap) vaccine  You may need a Td booster every 10 years. Varicella (chickenpox) vaccine  You may need this vaccine if you have not already been vaccinated. Human papillomavirus (HPV) vaccine  If recommended by your health care provider, you may need three doses over 6 months. Measles, mumps, and rubella (MMR) vaccine  You may need at least one dose of MMR. You may also need a second dose. Meningococcal conjugate (MenACWY) vaccine  One dose is recommended if you are 45-76 years old and a Market researcher living in a residence hall, or if you have one of several medical conditions. You may also need additional booster doses. Pneumococcal conjugate (PCV13) vaccine  You may need this if you have certain conditions and were not  previously vaccinated. Pneumococcal polysaccharide (PPSV23) vaccine  You may need one or two doses if you smoke cigarettes or if you have certain conditions. Hepatitis A vaccine  You may need this if you have certain conditions or if you travel or work in places where you may be exposed to hepatitis A. Hepatitis B vaccine  You may need this if you have certain conditions or if you travel or work in places where you may be exposed to hepatitis B. Haemophilus influenzae type b (Hib) vaccine  You may need this if you have certain risk factors. You may receive vaccines as individual doses or as more than one vaccine together in one shot (combination vaccines). Talk with your health care provider about the risks and benefits of combination vaccines. What tests do I need? Blood tests  Lipid and cholesterol levels. These may be checked every 5 years starting at age 17.  Hepatitis C test.  Hepatitis B test. Screening   Diabetes screening. This is done by checking your blood sugar (glucose) after you have not eaten for a while (fasting).  Sexually transmitted disease (STD) testing. Talk with your health care provider about your test results, treatment options, and if necessary, the need for more tests. Follow these instructions at home: Eating and drinking   Eat a diet that includes fresh fruits and vegetables, whole grains, lean protein, and low-fat dairy products.  Take vitamin and mineral supplements as recommended by your health care provider.  Do not drink alcohol if your health care provider tells you not to drink.  If you drink alcohol: ? Limit how much you have to 0-2  drinks a day. ? Be aware of how much alcohol is in your drink. In the [place] one drink equals one 12 oz bottle of beer (355 mL), one 5 oz glass of wine (148 mL), or one 1 oz glass of hard liquor (44 mL). Lifestyle  Take daily care of your teeth and gums.  Stay active. Exercise for at least 30 minutes on 5 or  more days each week.  Do not use any products that contain nicotine or tobacco, such as cigarettes, e-cigarettes, and chewing tobacco. If you need help quitting, ask your health care provider.  If you are sexually active, practice safe sex. Use a condom or other form of protection to prevent STIs (sexually transmitted infections). What's next?  Go to your health care provider once a year for a well check visit.  Ask your health care provider how often you should have your eyes and teeth checked.  Stay up to date on all vaccines. This information is not intended to replace advice given to you by your health care provider. Make sure you discuss any questions you have with your health care provider. Document Revised: 02/07/2018 Document Reviewed: 02/07/2018 Elsevier Patient Education  2020 [employer]  Health Maintenance, Male Adopting a healthy lifestyle and getting preventive care are important in promoting health and wellness. Ask your health care provider about:  The right schedule for you to have regular tests and exams.  Things you can do on your own to prevent diseases and keep yourself healthy. What should I know about diet, weight, and exercise? Eat a healthy diet   Eat a diet that includes plenty of vegetables, fruits, low-fat dairy products, and lean protein.  Do not eat a lot of foods that are high in solid fats, added sugars, or sodium. Maintain a healthy weight Body mass index (BMI) is a measurement that can be used to identify possible weight problems. It estimates body fat based on height and weight. Your health care provider can help determine your BMI and help you achieve or maintain a healthy weight. Get regular exercise Get regular exercise. This is one of the most important things you can do for your health. Most adults should:  Exercise for at least 150 minutes each week. The exercise should increase your heart rate and make you sweat (moderate-intensity  exercise).  Do strengthening exercises at least twice a week. This is in addition to the moderate-intensity exercise.  Spend less time sitting. Even light physical activity can be beneficial. Watch cholesterol and blood lipids Have your blood tested for lipids and cholesterol at 36 years of age, then have this test every 5 years. You may need to have your cholesterol levels checked more often if:  Your lipid or cholesterol levels are high.  You are older than 36 years of age.  You are at high risk for heart disease. What should I know about cancer screening? Many types of cancers can be detected early and may often be prevented. Depending on your health history and family history, you may need to have cancer screening at various ages. This may include screening for:  Colorectal cancer.  Prostate cancer.  Skin cancer.  Lung cancer. What should I know about heart disease, diabetes, and high blood pressure? Blood pressure and heart disease  High blood pressure causes heart disease and increases the risk of stroke. This is more likely to develop in people who have high blood pressure readings, are of African descent, or are overweight.  Talk   with your health care provider about your target blood pressure readings.  Have your blood pressure checked: ? Every 3-5 years if you are 18-39 years of age. ? Every year if you are 40 years old or older.  If you are between the ages of 65 and 75 and are a current or former smoker, ask your health care provider if you should have a one-time screening for abdominal aortic aneurysm (AAA). Diabetes Have regular diabetes screenings. This checks your fasting blood sugar level. Have the screening done:  Once every three years after age 45 if you are at a normal weight and have a low risk for diabetes.  More often and at a younger age if you are overweight or have a high risk for diabetes. What should I know about preventing infection? Hepatitis  B If you have a higher risk for hepatitis B, you should be screened for this virus. Talk with your health care provider to find out if you are at risk for hepatitis B infection. Hepatitis C Blood testing is recommended for:  Everyone born from 1945 through 1965.  Anyone with known risk factors for hepatitis C. Sexually transmitted infections (STIs)  You should be screened each year for STIs, including gonorrhea and chlamydia, if: ? You are sexually active and are younger than 36 years of age. ? You are older than 36 years of age and your health care provider tells you that you are at risk for this type of infection. ? Your sexual activity has changed since you were last screened, and you are at increased risk for chlamydia or gonorrhea. Ask your health care provider if you are at risk.  Ask your health care provider about whether you are at high risk for HIV. Your health care provider may recommend a prescription medicine to help prevent HIV infection. If you choose to take medicine to prevent HIV, you should first get tested for HIV. You should then be tested every 3 months for as long as you are taking the medicine. Follow these instructions at home: Lifestyle  Do not use any products that contain nicotine or tobacco, such as cigarettes, e-cigarettes, and chewing tobacco. If you need help quitting, ask your health care provider.  Do not use street drugs.  Do not share needles.  Ask your health care provider for help if you need support or information about quitting drugs. Alcohol use  Do not drink alcohol if your health care provider tells you not to drink.  If you drink alcohol: ? Limit how much you have to 0-2 drinks a day. ? Be aware of how much alcohol is in your drink. In the [place] one drink equals one 12 oz bottle of beer (355 mL), one 5 oz glass of wine (148 mL), or one 1 oz glass of hard liquor (44 mL). General instructions  Schedule regular health, dental, and eye  exams.  Stay current with your vaccines.  Tell your health care provider if: ? You often feel depressed. ? You have ever been abused or do not feel safe at home. Summary  Adopting a healthy lifestyle and getting preventive care are important in promoting health and wellness.  Follow your health care provider's instructions about healthy diet, exercising, and getting tested or screened for diseases.  Follow your health care provider's instructions on monitoring your cholesterol and blood pressure. This information is not intended to replace advice given to you by your health care provider. Make sure you discuss any questions you   have with your health care provider. Document Revised: 02/06/2018 Document Reviewed: 02/06/2018 Elsevier Patient Education  2020 [employer] Risks of Smoking Smoking cigarettes is very bad for your health. Tobacco smoke has over 200 known poisons in it. It contains the poisonous gases nitrogen oxide and carbon monoxide. There are over 60 chemicals in tobacco smoke that cause cancer. Smoking is difficult to quit because a chemical in tobacco, called nicotine, causes addiction or dependence. When you smoke and inhale, nicotine is absorbed rapidly into the bloodstream through your lungs. Both inhaled and non-inhaled nicotine may be addictive. What are the risks of cigarette smoke? Cigarette smokers have an increased risk of many serious medical problems, including:  Lung cancer.  Lung disease, such as pneumonia, bronchitis, and emphysema.  Chest pain (angina) and heart attack because the heart is not getting enough oxygen.  Heart disease and peripheral blood vessel disease.  High blood pressure (hypertension).  Stroke.  Oral cancer, including cancer of the lip, mouth, or voice box.  Bladder cancer.  Pancreatic cancer.  Cervical cancer.  Pregnancy complications, including premature birth.  Stillbirths and smaller newborn babies, birth  defects, and genetic damage to sperm.  Early menopause.  Lower estrogen level for women.  Infertility.  Facial wrinkles.  Blindness.  Increased risk of broken bones (fractures).  Senile dementia.  Stomach ulcers and internal bleeding.  Delayed wound healing and increased risk of complications during surgery.  Even smoking lightly shortens your life expectancy by several years. Because of secondhand smoke exposure, children of smokers have an increased risk of the following:  Sudden infant death syndrome (SIDS).  Respiratory infections.  Lung cancer.  Heart disease.  Ear infections. What are the benefits of quitting? There are many health benefits of quitting smoking. Here are some of them:  Within days of quitting smoking, your risk of having a heart attack decreases, your blood flow improves, and your lung capacity improves. Blood pressure, pulse rate, and breathing patterns start returning to normal soon after quitting.  Within months, your lungs may clear up completely.  Quitting for 10 years reduces your risk of developing lung cancer and heart disease to almost that of a nonsmoker.  People who quit may see an improvement in their overall quality of life. How do I quit smoking?     Smoking is an addiction with both physical and psychological effects, and longtime habits can be hard to change. Your health care provider can recommend:  Programs and community resources, which may include group support, education, or talk therapy.  Prescription medicines to help reduce cravings.  Nicotine replacement products, such as patches, gum, and nasal sprays. Use these products only as directed. Do not replace cigarette smoking with electronic cigarettes, which are commonly called e-cigarettes. The safety of e-cigarettes is not known, and some may contain harmful chemicals.  A combination of two or more of these methods. Where to find more information  American Lung  Association: www.lung.org  American Cancer Society: www.cancer.org Summary  Smoking cigarettes is very bad for your health. Cigarette smokers have an increased risk of many serious medical problems, including several cancers, heart disease, and stroke.  Smoking is an addiction with both physical and psychological effects, and longtime habits can be hard to change.  By stopping right away, you can greatly reduce the risk of medical problems for you and your family.  To help you quit smoking, your health care provider can recommend programs, community resources, prescription medicines, and nicotine replacement products  such as patches, gum, and nasal sprays. This information is not intended to replace advice given to you by your health care provider. Make sure you discuss any questions you have with your health care provider. Document Revised: 05/17/2017 Document Reviewed: 02/18/2016 Elsevier Patient Education  2020 [employer].

## 2019-04-01 LAB — HIV ANTIBODY (ROUTINE TESTING W REFLEX): HIV 1&2 Ab, 4th Generation: NONREACTIVE

## 2019-06-04 ENCOUNTER — Ambulatory Visit: Payer: PRIVATE HEALTH INSURANCE | Attending: Internal Medicine

## 2019-06-04 DIAGNOSIS — Z20822 Contact with and (suspected) exposure to covid-19: Secondary | ICD-10-CM

## 2019-06-05 LAB — NOVEL CORONAVIRUS, NAA: SARS-CoV-2, NAA: NOT DETECTED

## 2019-06-05 LAB — SARS-COV-2, NAA 2 DAY TAT

## 2019-07-31 IMAGING — CT CT MAXILLOFACIAL W/O CM
3 series · 16 of 47 positions shown, 19 images · non-contrast
Comparison: Head CT dated 10/21/2014

CLINICAL DATA: 31-year-old male with facial trauma.

EXAM:
CT MAXILLOFACIAL WITHOUT CONTRAST
TECHNIQUE: Multidetector CT imaging of the maxillofacial structures was
performed. Multiplanar CT image reconstructions were also generated.

[Series 3: max soft · axial · 0.36mm/px · z∈[-134,+16]mm · 10 of 89 slices shown, 13 images]
[im 7/89  brain]
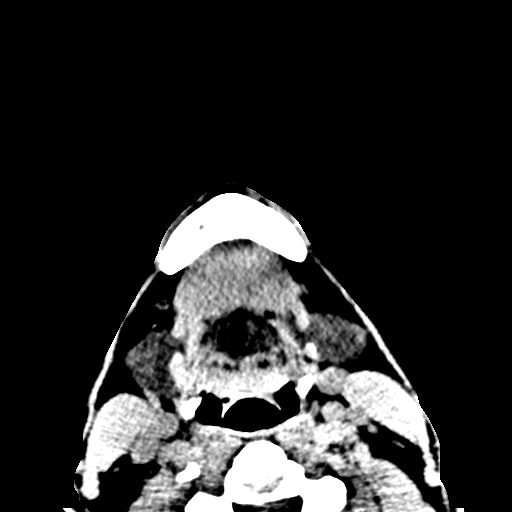
[im 7/89  bone]
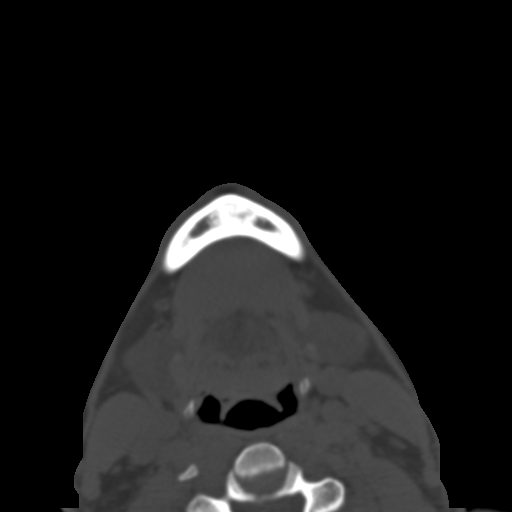
[im 16/89  bone]
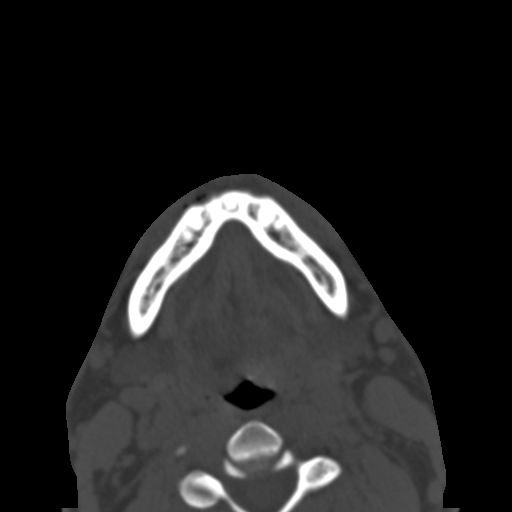
[im 25/89  bone]
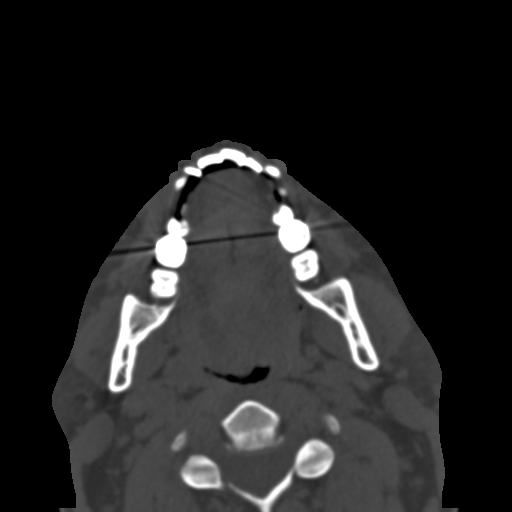
[im 31/89  bone]
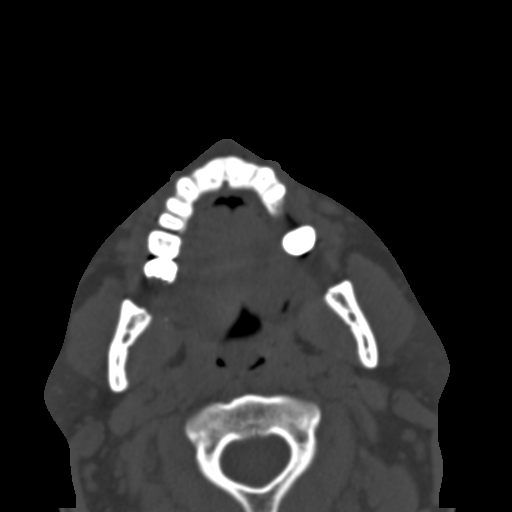
[im 40/89  brain]
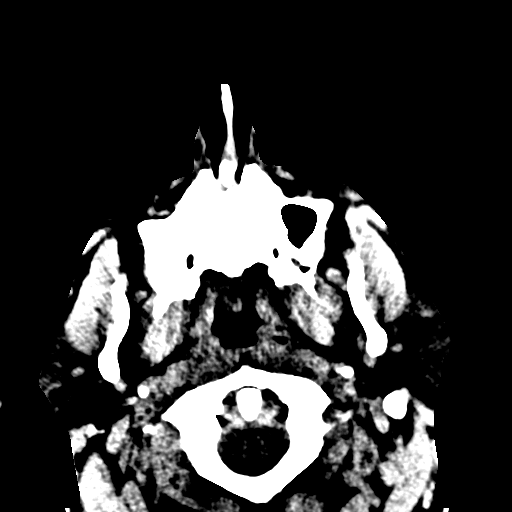
[im 40/89  bone]
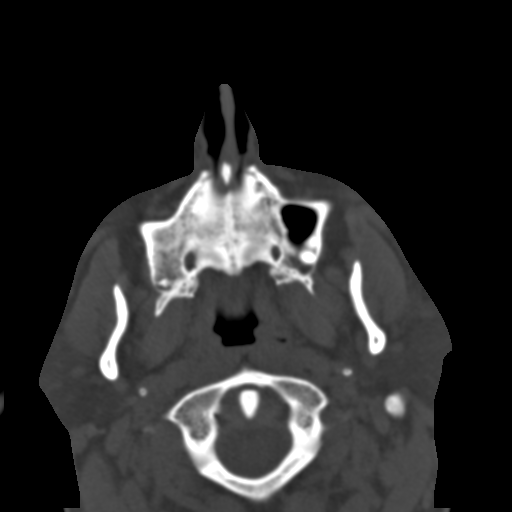
[im 49/89  bone]
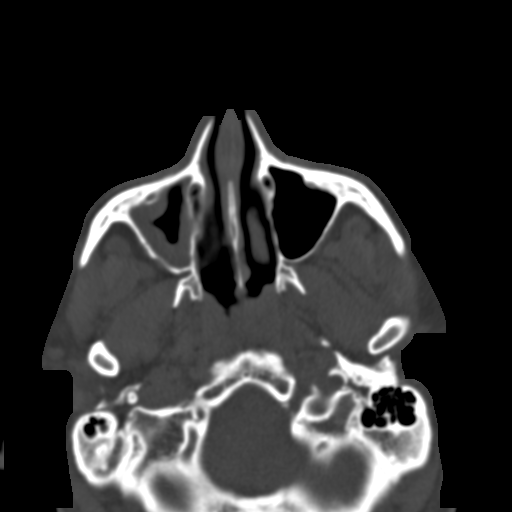
[im 58/89  bone]
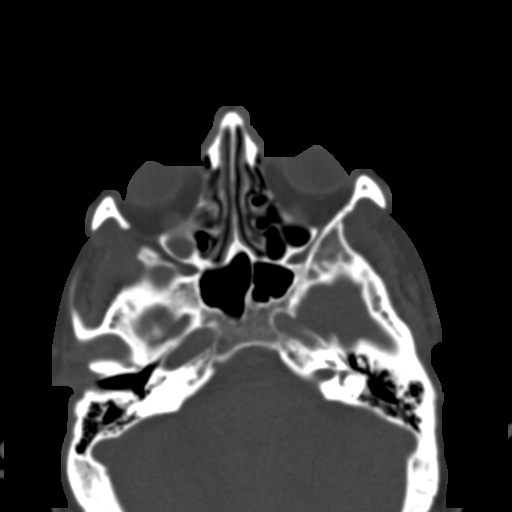
[im 67/89  bone]
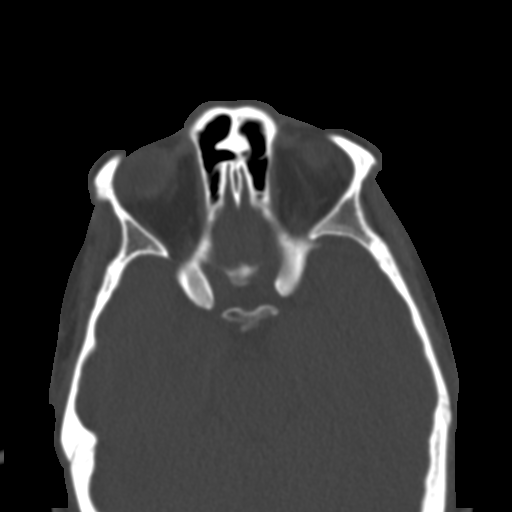
[im 73/89  brain]
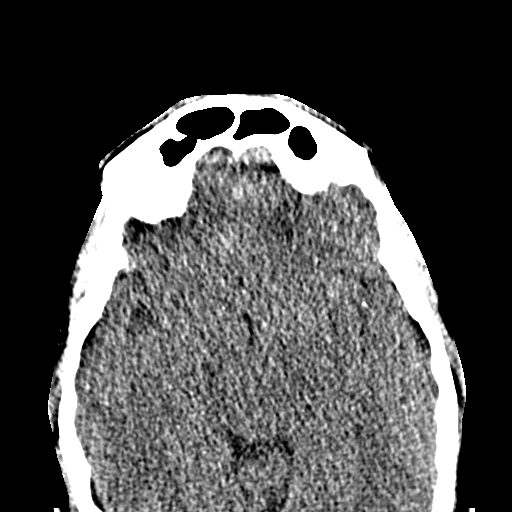
[im 73/89  bone]
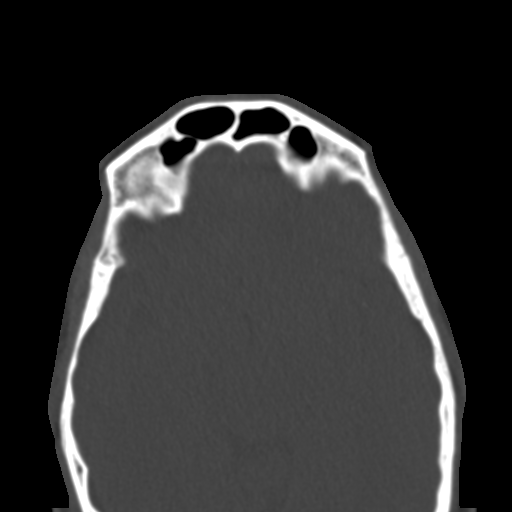
[im 82/89  bone]
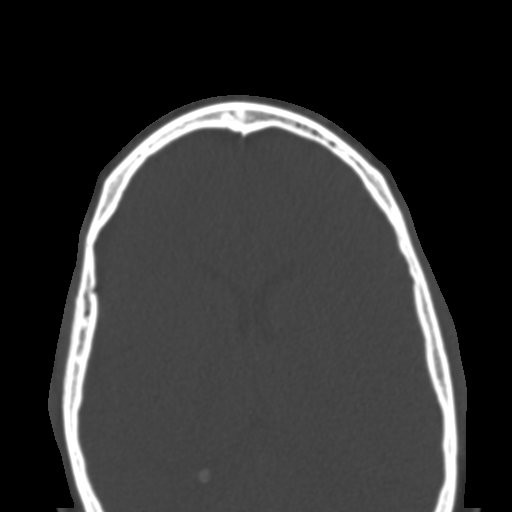

[Series 5: coronal soft · coronal · 0.30mm/px · 3 of 70 slices shown]
[im 24/70  bone]
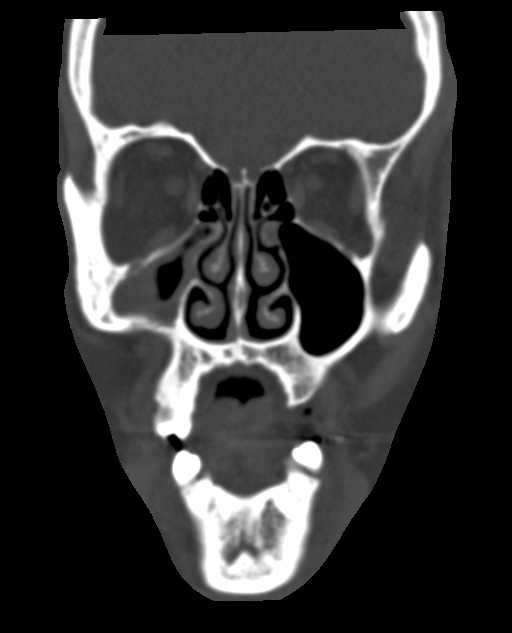
[im 31/70  bone]
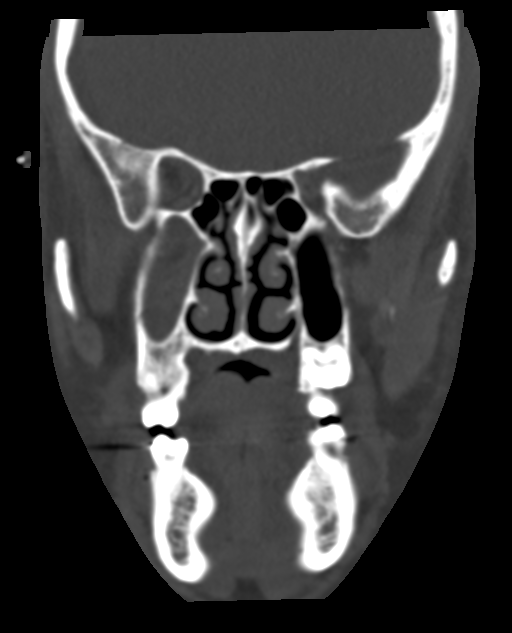
[im 39/70  bone]
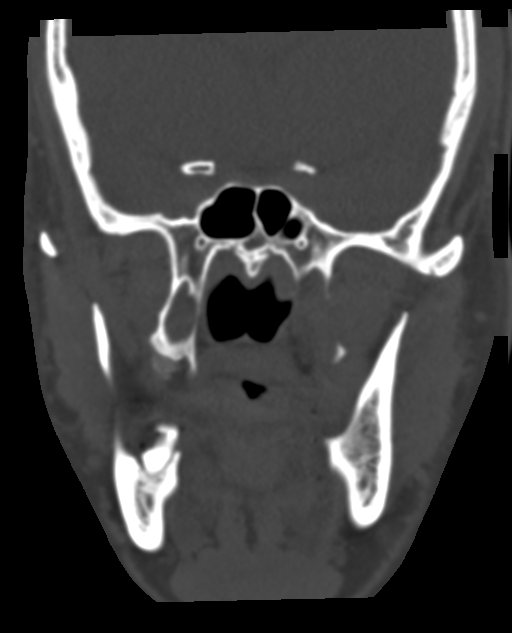

[Series 6: sagittal soft · sagittal · 0.32mm/px · 3 of 78 slices shown]
[im 26/78  bone]
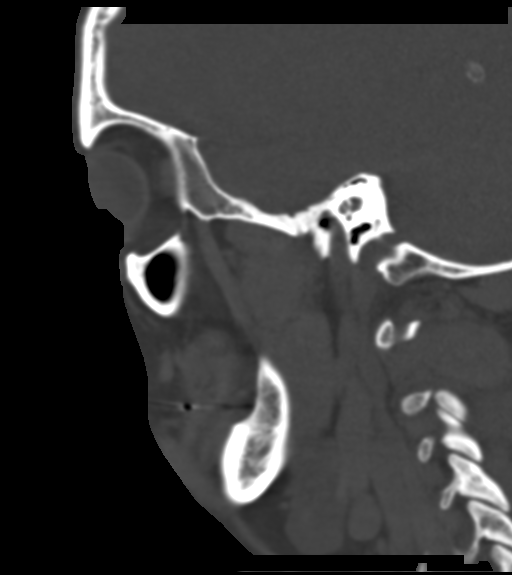
[im 39/78  bone]
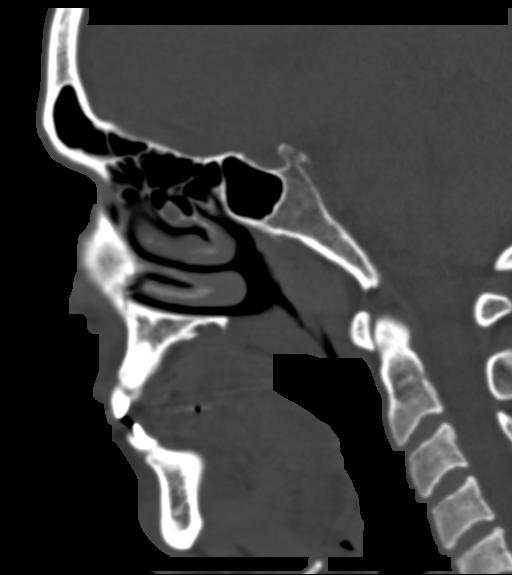
[im 52/78  bone]
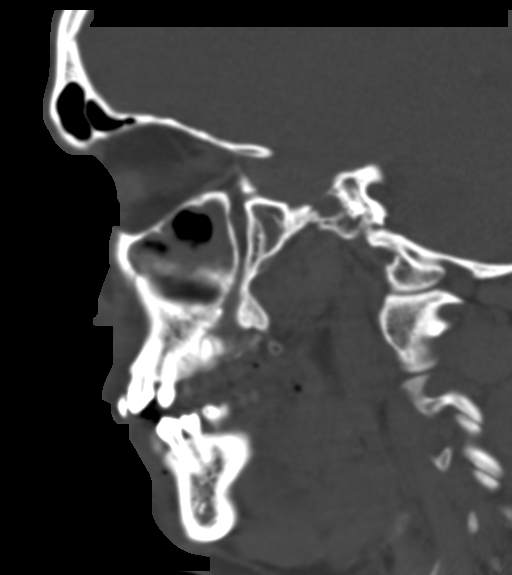

[16 of 47 positions shown; findings below may reference images not displayed]

FINDINGS: Osseous: No fracture or mandibular dislocation. No destructive
process.

Orbits: Negative. No traumatic or inflammatory finding.

Sinuses: There is diffuse mucoperiosteal thickening of the right
maxillary sinus. No air-fluid level. The remainder of the visualized
paranasal sinuses and mastoid air cells are clear.

Soft tissues: Negative.

Limited intracranial: No significant or unexpected finding.
IMPRESSION: No acute facial bone fractures.

## 2020-01-26 ENCOUNTER — Encounter (HOSPITAL_COMMUNITY): Payer: Self-pay | Admitting: *Deleted

## 2020-01-26 ENCOUNTER — Ambulatory Visit (HOSPITAL_COMMUNITY)
Admission: EM | Admit: 2020-01-26 | Discharge: 2020-01-26 | Disposition: A | Discharge status: Home / Self Care | Payer: PRIVATE HEALTH INSURANCE | Attending: Family Medicine | Admitting: Family Medicine

## 2020-01-26 ENCOUNTER — Other Ambulatory Visit: Payer: Self-pay

## 2020-01-26 DIAGNOSIS — R600 Localized edema: Secondary | ICD-10-CM | POA: Diagnosis not present

## 2020-01-26 NOTE — ED Triage Notes (Signed)
PT also reported he felt tightness in his RT jaw when eating.

## 2020-01-26 NOTE — Discharge Instructions (Signed)
Drink more water Warm compresses to face Sour candy

## 2020-01-26 NOTE — ED Provider Notes (Signed)
[facility]    CSN: [id number] Arrival date & time: 01/26/20  1550      History   Chief Complaint Chief Complaint  Patient presents with  . Facial Pain    RT    HPI Mason Perez is a 34 y.o. male.   HPI  Pain and swelling right face since yesterday.  No dental problems.  No pain.  No fever.  History reviewed. No pertinent past medical history.  Patient Active Problem List   Diagnosis Date Noted  . Healthcare maintenance 03/31/2019  . Nicotine addiction 11/19/2012    Past Surgical History:  Procedure Laterality Date  . HERNIA REPAIR         Home Medications    Prior to Admission medications   Medication Sig Start Date End Date Taking? Authorizing Provider  cyclobenzaprine (FLEXERIL) 10 MG tablet Take 1 tablet (10 mg total) by mouth 3 (three) times daily as needed for muscle spasms. Patient not taking: Reported on 03/31/2019 01/29/18   Molpus, [name], MD  HYDROcodone-acetaminophen (NORCO) 5-325 MG tablet Take 1 tablet by mouth every 6 (six) hours as needed (for pain). Patient not taking: Reported on 03/31/2019 01/29/18   Molpus, [name], MD    Family History Family History  Problem Relation Age of Onset  . Diabetes Mother   . Diabetes Father   . Liver disease Father   . Drug abuse Maternal Grandmother   . Drug abuse Maternal Grandfather     Social History Social History   Tobacco Use  . Smoking status: Current Every Day Smoker    Packs/day: 0.50    Types: Cigarettes  . Smokeless tobacco: Never Used  Vaping Use  . Vaping Use: Never used  Substance Use Topics  . Alcohol use: Yes    Alcohol/week: 1.0 standard drink    Types: 1 Shots of liquor per week    Comment: social  . Drug use: No     Allergies   Patient has no known allergies.   Review of Systems Review of Systems See HPI  Physical Exam Triage Vital Signs ED Triage Vitals  Enc Vitals Group     BP 01/26/20 1707 121/70     Pulse Rate 01/26/20 1707 78     Resp 01/26/20  1707 18     Temp 01/26/20 1707 98.1 F (36.7 C)     Temp Source 01/26/20 1707 Oral     SpO2 01/26/20 1707 98 %     Weight 01/26/20 1710 220 lb (99.8 kg)     Height 01/26/20 1710 5\' 11"  (1.803 m)     Head Circumference --      Peak Flow --      Pain Score 01/26/20 1710 0     Pain Loc --      Pain Edu? --      Excl. in GC? --    No data found.  Updated Vital Signs BP 121/70 (BP Location: Right Arm)   Pulse 78   Temp 98.1 F (36.7 C) (Oral)   Resp 18   Ht 5\' 11"  (1.803 m)   Wt 99.8 kg   SpO2 98%   BMI 30.68 kg/m      Physical Exam Constitutional:      General: He is not in acute distress.    Appearance: He is well-developed.  HENT:     Head: Normocephalic and atraumatic.     Comments: Nontender swelling right parotid    Right Ear: Tympanic membrane and ear canal  normal.     Left Ear: Tympanic membrane and ear canal normal.     Nose: Nose normal.     Mouth/Throat:     Mouth: Mucous membranes are moist.     Pharynx: No posterior oropharyngeal erythema.  Eyes:     Conjunctiva/sclera: Conjunctivae normal.     Pupils: Pupils are equal, round, and reactive to light.  Cardiovascular:     Rate and Rhythm: Normal rate.  Pulmonary:     Effort: Pulmonary effort is normal. No respiratory distress.  Abdominal:     General: There is no distension.     Palpations: Abdomen is soft.  Musculoskeletal:        General: Normal range of motion.     Cervical back: Normal range of motion.  Lymphadenopathy:     Cervical: No cervical adenopathy.  Skin:    General: Skin is warm and dry.  Neurological:     Mental Status: He is alert.  Psychiatric:        Behavior: Behavior normal.      UC Treatments / Results  Labs (all labs ordered are listed, but only abnormal results are displayed) Labs Reviewed - No data to display  EKG   Radiology No results found.  Procedures Procedures (including critical care time)  Medications Ordered in UC Medications - No data to  display  Initial Impression / Assessment and Plan / UC Course  I have reviewed the triage vital signs and the nursing notes.  Pertinent labs & imaging results that were available during my care of the patient were reviewed by me and considered in my medical decision making (see chart for details).     *Likely stone.  Discussed treatment.  ENT if fails to improve Final Clinical Impressions(s) / UC Diagnoses   Final diagnoses:  Swelling of right parotid gland     Discharge Instructions     Drink more water Warm compresses to face Sour candy   ED Prescriptions    None     PDMP not reviewed this encounter.   Eustace Moore, MD 01/26/20 [phone]

## 2020-01-26 NOTE — ED Triage Notes (Signed)
Pt presents with RT facial swelling. Pt denies any injuries to face and Pt reports he does not have any dental problems.

## 2020-04-09 ENCOUNTER — Encounter: Payer: Self-pay | Admitting: Family Medicine

## 2020-04-09 ENCOUNTER — Other Ambulatory Visit: Payer: Self-pay

## 2020-04-09 ENCOUNTER — Ambulatory Visit (INDEPENDENT_AMBULATORY_CARE_PROVIDER_SITE_OTHER): Payer: PRIVATE HEALTH INSURANCE | Admitting: Family Medicine

## 2020-04-09 VITALS — BP 115/68 | HR 96 | Temp 97.4°F | Ht 71.0 in | Wt 225.6 lb

## 2020-04-09 DIAGNOSIS — E785 Hyperlipidemia, unspecified: Secondary | ICD-10-CM | POA: Insufficient documentation

## 2020-04-09 DIAGNOSIS — Z716 Tobacco abuse counseling: Secondary | ICD-10-CM

## 2020-04-09 DIAGNOSIS — J40 Bronchitis, not specified as acute or chronic: Secondary | ICD-10-CM

## 2020-04-09 DIAGNOSIS — F17209 Nicotine dependence, unspecified, with unspecified nicotine-induced disorders: Secondary | ICD-10-CM | POA: Diagnosis not present

## 2020-04-09 DIAGNOSIS — K649 Unspecified hemorrhoids: Secondary | ICD-10-CM | POA: Diagnosis not present

## 2020-04-09 LAB — LIPID PANEL
Cholesterol: 219 mg/dL — ABNORMAL HIGH (ref <200)
HDL: 34 mg/dL — ABNORMAL LOW (ref >=40)
LDL Cholesterol (Calc): 159 mg/dL (calc) — ABNORMAL HIGH
Non-HDL Cholesterol (Calc): 185 mg/dL (calc) — ABNORMAL HIGH (ref <130)
Total CHOL/HDL Ratio: 6.4 (calc) — ABNORMAL HIGH (ref <5.0)
Triglycerides: 134 mg/dL (ref <150)

## 2020-04-09 MED ORDER — HYDROCORTISONE (PERIANAL) 2.5 % EX CREA: 1.0000 "application " | TOPICAL_CREAM | Freq: Two times a day (BID) | CUTANEOUS | 0 refills | Status: DC

## 2020-04-09 MED ORDER — BENZONATATE 100 MG PO CAPS: 100.0000 mg | ORAL_CAPSULE | Freq: Two times a day (BID) | ORAL | 0 refills | Status: DC | PRN

## 2020-04-09 NOTE — Progress Notes (Signed)
[facility] [address] [place] [address] Dept: [phone] Dept Fax: [phone]  Acute Office Visit  Subjective:    Patient ID: Mason Perez, male    DOB: Jul 21, 1985, 35 y.o..   MRN: [id number]  Chief Complaint  Patient presents with  . Cough    Cough x 2 weeks little dizzy few weeks ago one day only.     History of Present Illness:  Patient is in today with a 2 week history of a cough, productive of phlegm. He denies any fever, nasal congestion, sneezing, sore throat, or GI symptoms. He normally smokes 1 ppd of cigarettes, but has cut down to 1/2 ppd. He had an episode last week where he felt shaky and lightheaded for part of a day. He was able to eat and felt improved.  Mason Perez also notes a history fo hemorrhoids. He states, more recently, he has had some bleeding of his external hemorrhoids. These have not been significantly swollen or tende.r. He has used OTC creams without improvement. He regularly uses sanitary wipes.  Past Medical History: Patient Active Problem List   Diagnosis Date Noted  . Borderline hyperlipidemia 04/09/2020  . Tobacco use disorder, continuous 04/09/2020  . Healthcare maintenance 03/31/2019  . Nicotine addiction 11/19/2012   Past Surgical History:  Procedure Laterality Date  . HERNIA REPAIR     Family History  Problem Relation Age of Onset  . Diabetes Mother   . Diabetes Father   . Liver disease Father   . Drug abuse Maternal Grandmother   . Drug abuse Maternal Grandfather    Outpatient Medications Prior to Visit  Medication Sig Dispense Refill  . cyclobenzaprine (FLEXERIL) 10 MG tablet Take 1 tablet (10 mg total) by mouth 3 (three) times daily as needed for muscle spasms. (Patient not taking: No sig reported) 20 tablet 0  . HYDROcodone-acetaminophen (NORCO) 5-325 MG tablet Take 1 tablet by mouth every 6 (six) hours as needed (for pain). (Patient not taking: No sig  reported) 20 tablet 0   No facility-administered medications prior to visit.   No Known Allergies    Objective:   Today's Vitals   04/09/20 1413  BP: 115/68  Pulse: 96  Temp: (!) 97.4 F (36.3 C)  TempSrc: Temporal  SpO2: 98%  Weight: 225 lb 9.6 oz (102.3 kg)  Height: 5\' 11"  (1.803 m)   Body mass index is 31.46 kg/m.   General: Well developed, well nourished. No acute distress. HEENT: Normocephalic, non-traumatic. PERRL, EOMI. Conjunctiva clear.  External ears normal. EAC and TMs normal  bilaterally. Nose clear without congestion or rhinorrhea. Mucous membranes moist. Oropharynx clear. Good dentition. Neck: Supple. No lymphadenopathy. No thyromegaly. Lungs: Mostly clear to auscultation bilaterally. Mild mucous sounds that clear with cough. CV: RRR without murmurs or rubs. Pulses 2+ bilaterally. Abdomen: Soft, non-tender. No hepatosplenomegaly. No rebound or guarding. Extremities: Full ROM. No joint swelling or tenderness. No edema noted. Skin: Warm and dry. No rashes. Psych: Alert and oriented. Normal mood and affect.  Health Maintenance Due  Topic Date Due  . Hepatitis C Screening  Never done     Assessment & Plan:   1. Bronchitis We discussed the usual viral nature of bronchitis and typical duration of coughing. Antibiotics would not be indicated. However, Tessalon may help improve his cough symptoms.  - benzonatate (TESSALON) 100 MG capsule; Take 1 capsule (100 mg total) by mouth 2 (two) times daily as needed for cough.  Dispense: 20 capsule; Refill:  0  2. Hemorrhoids, unspecified hemorrhoid type We will swithc to Anusol HC cream. If not improving, consider surgical consult.  - hydrocortisone (ANUSOL-HC) 2.5 % rectal cream; Place 1 application rectally 2 (two) times daily.  Dispense: 30 g; Refill: 0  3. Borderline hyperlipidemia Due for rescreening.  - Lipid panel  4. Tobacco use disorder, continuous 5. Tobacco abuse counseling  Supported his effort to cut  back currently, We discussed further steps he can take towards quitting.  Loyola Mast, MD

## 2020-04-12 NOTE — Progress Notes (Signed)
Total cholesterol and LDL (bad) cholesterol are elevated. Patient's risk score indicates need to consider starting a statin to lower CV risk, esp. In light of ongoing tobacco use. I will ask MA to reach out to patient and see if he is willing to start  statin.

## 2020-04-14 MED ORDER — ATORVASTATIN CALCIUM 40 MG PO TABS: 40.0000 mg | ORAL_TABLET | Freq: Every day | ORAL | 3 refills | Status: AC

## 2020-04-14 NOTE — Addendum Note (Signed)
Addended by: Loyola Mast on: 04/14/2020 01:13 PM   Modules accepted: Orders

## 2021-03-09 ENCOUNTER — Ambulatory Visit: Payer: PRIVATE HEALTH INSURANCE | Admitting: Family Medicine

## 2021-03-10 ENCOUNTER — Ambulatory Visit: Payer: No Typology Code available for payment source | Admitting: Family Medicine

## 2021-03-10 ENCOUNTER — Telehealth: Payer: Self-pay | Admitting: Family Medicine
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
## 2021-03-15 NOTE — Telephone Encounter (Signed)
Did not count as no show due to situation.

## 2021-03-31 ENCOUNTER — Other Ambulatory Visit: Payer: Self-pay

## 2021-03-31 ENCOUNTER — Ambulatory Visit (INDEPENDENT_AMBULATORY_CARE_PROVIDER_SITE_OTHER): Payer: No Typology Code available for payment source | Admitting: Nurse Practitioner

## 2021-03-31 VITALS — BP 112/84 | HR 92 | Temp 97.6°F | Resp 16 | Ht 71.0 in | Wt 239.1 lb

## 2021-03-31 DIAGNOSIS — L03312 Cellulitis of back [any part except buttock]: Secondary | ICD-10-CM | POA: Diagnosis not present

## 2021-03-31 DIAGNOSIS — L989 Disorder of the skin and subcutaneous tissue, unspecified: Secondary | ICD-10-CM | POA: Diagnosis not present

## 2021-03-31 DIAGNOSIS — K625 Hemorrhage of anus and rectum: Secondary | ICD-10-CM | POA: Diagnosis not present

## 2021-03-31 DIAGNOSIS — K602 Anal fissure, unspecified: Secondary | ICD-10-CM | POA: Diagnosis not present

## 2021-03-31 DIAGNOSIS — R42 Dizziness and giddiness: Secondary | ICD-10-CM

## 2021-03-31 MED ORDER — DOXYCYCLINE HYCLATE 100 MG PO TABS: 100.0000 mg | ORAL_TABLET | Freq: Two times a day (BID) | ORAL | 0 refills | Status: AC

## 2021-03-31 NOTE — Assessment & Plan Note (Addendum)
Having blood in toilet tissue when he has bowel movements.  Does have a history of hemorrhoids.  Has been using Anusol without great relief.  Rectal exam does not show any hemorrhoids possible area of maceration/fissure.  Did discuss conservative over-the-counter treatment and patient would like to be referred to gastroenterology as he has recurrent problems with hemorrhoids.  Referral placed Pending basic labs in office

## 2021-03-31 NOTE — Assessment & Plan Note (Signed)
Patient describes "athlete's foot".  Upon exam feet are dry and he has cracking in the webs of between 3 and 4, 4 and 5 toes.  Discuss basic things to try over-the-counter inclusive of changing socks, keeping feet dry, padding dry after showers, and using lotion in between toes.  Patient states has been doing all this without great relief.  States also tried over-the-counter cream such as Lotrimin and powders without great relief patient would like referral to podiatry.  At his behest referral placed

## 2021-03-31 NOTE — Assessment & Plan Note (Signed)
Looks to be some type of bite that has turned cellulitic to left lower lateral back.  Patient has a history of abscess and resultant cellulitis in the past.  Places indurated and erythemic.  Will start doxycycline 100 mg twice daily for 7 days.  If no improvement patient needs to follow-up in clinic

## 2021-03-31 NOTE — Progress Notes (Signed)
Acute Office Visit  Subjective:    Patient ID: Mason Perez, male    DOB: [birth year], 36 y.o.   MRN: [id number]  Chief Complaint  Patient presents with   Rectal Pain    Off and on for a year. Has used Anusol suppository and its not helping. Pain and burning present, blood on the toilet paper.    Insect Bite    Noticed it about 3 to 4 day ago, located on lower left side of the back. He was four-wheeling this past Sunday 03/27/21 and noticed this after, he had a stinging sensation in his back. Area is raised, red and swollen.     HPI Patient is in today for Rectal pain  States that he has had hemmorrhoids in the past and they will go away and then come back. States they have been present for the last few months. States that he is lactose intolerant nad goes to bathroom often will sit on toliet for 30 mintues. Has notiec dark red that ranges from a lot to a little. Also some supp without relief .  Bite: states that he was going four wheeling and felt a needle like poke on the left back. Feels like it has gotten bigger and more pressure. Been having headaches with it over the last two days. Coming and going and has used OTC analgesic with some relief.  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 He endorses eating and drinking. States he does not drink lots of water and that he mainly consumes coffee (3-5 cups daily).   No past medical history on file.  Past Surgical History:  Procedure Laterality Date   HERNIA REPAIR      Family History  Problem Relation Age of Onset   Diabetes Mother    Diabetes Father    Liver disease Father    Drug abuse Maternal Grandmother    Drug abuse Maternal Grandfather     Social History   Socioeconomic History   Marital status: Married    Spouse name: Not on file   Number of children: Not on file    Years of education: Not on file   Highest education level: Not on file  Occupational History   Not on file  Tobacco Use   Smoking status: Every Day    Packs/day: 0.50    Types: Cigarettes   Smokeless tobacco: Never  Vaping Use   Vaping Use: Never used  Substance and Sexual Activity   Alcohol use: Yes    Alcohol/week: 1.0 standard drink    Types: 1 Shots of liquor per week    Comment: social   Drug use: No   Sexual activity: Yes  Other Topics Concern   Not on file  Social History Narrative   Not on file   Social Determinants of Health   Financial Resource Strain: Not on file  Food Insecurity: Not on file  Transportation Needs: Not on file  Physical Activity: Not on file  Stress: Not on file  Social Connections: Not on file  Intimate Partner Violence: Not on file    Outpatient Medications Prior to Visit  Medication Sig Dispense Refill   hydrocortisone (ANUSOL-HC) 2.5 % rectal cream Place 1 application rectally 2 (two) times daily. 30 g 0   atorvastatin (LIPITOR) 40 MG tablet Take 1  tablet (40 mg total) by mouth daily. (Patient not taking: Reported on 03/31/2021) 90 tablet 3   benzonatate (TESSALON) 100 MG capsule Take 1 capsule (100 mg total) by mouth 2 (two) times daily as needed for cough. 20 capsule 0   No facility-administered medications prior to visit.    No Known Allergies  Review of Systems  Constitutional:  Negative for chills and fever.  Gastrointestinal:  Positive for anal bleeding and blood in stool. Negative for abdominal pain, diarrhea, nausea and vomiting.  Genitourinary:  Negative for dysuria and hematuria.  Skin:  Positive for color change and rash.  Neurological:  Negative for headaches.      Objective:    Physical Exam Vitals and nursing note reviewed.  Constitutional:      Appearance: He is obese.  Cardiovascular:     Rate and Rhythm: Normal rate and regular rhythm.     Heart sounds: Normal heart sounds.  Pulmonary:     Effort:  Pulmonary effort is normal.     Breath sounds: Normal breath sounds.  Abdominal:     General: Bowel sounds are normal. There is no distension.     Palpations: There is no mass.     Tenderness: There is no abdominal tenderness.  Genitourinary:    Rectum: Anal fissure present. No mass, tenderness, external hemorrhoid or internal hemorrhoid. Normal anal tone.       Comments: Anal fissure Feet:     Right foot:     Skin integrity: Dry skin present.     Left foot:     Skin integrity: Dry skin present.     Comments: Cracking between 4th and 5th toe in the web spaces bilaterally Skin:    General: Skin is warm.     Findings: Lesion present. No abscess or ecchymosis.          Comments: 4 cm x 3.5 cm reddened and indurated circular spot  Neurological:     Mental Status: He is alert.    BP 112/84    Pulse 92    Temp 97.6 F (36.4 C)    Resp 16    Ht 5\' 11"  (1.803 m)    Wt 239 lb 2 oz (108.5 kg)    SpO2 98%    BMI 33.35 kg/m  Wt Readings from Last 3 Encounters:  03/31/21 239 lb 2 oz (108.5 kg)  04/09/20 225 lb 9.6 oz (102.3 kg)  01/26/20 220 lb (99.8 kg)    Health Maintenance Due  Topic Date Due   Hepatitis C Screening  Never done    There are no preventive care reminders to display for this patient.   Lab Results  Component Value Date   TSH 1.99 03/31/2019   Lab Results  Component Value Date   WBC 6.6 03/31/2019   HGB 15.1 03/31/2019   HCT 44.4 03/31/2019   MCV 95.2 03/31/2019   PLT 246.0 03/31/2019   Lab Results  Component Value Date   NA 138 03/31/2019   K 3.9 03/31/2019   CO2 28 03/31/2019   GLUCOSE 78 03/31/2019   BUN 10 03/31/2019   CREATININE 0.86 03/31/2019   BILITOT 0.7 03/31/2019   ALKPHOS 61 03/31/2019   AST 20 03/31/2019   ALT 26 03/31/2019   PROT 7.0 03/31/2019   ALBUMIN 4.3 03/31/2019   CALCIUM 9.1 03/31/2019   ANIONGAP 6 02/20/2015   GFR 101.14 03/31/2019   Lab Results  Component Value Date   CHOL 219 (H) 04/09/2020   Lab  Results   Component Value Date   HDL 34 (L) 04/09/2020   Lab Results  Component Value Date   LDLCALC 159 (H) 04/09/2020   Lab Results  Component Value Date   TRIG 134 04/09/2020   Lab Results  Component Value Date   CHOLHDL 6.4 (H) 04/09/2020   Lab Results  Component Value Date   HGBA1C 5.7 03/31/2019       Assessment & Plan:   Problem List Items Addressed This Visit       Digestive   BRBPR (bright red blood per rectum)    Having blood in toilet tissue when he has bowel movements.  Does have a history of hemorrhoids.  Has been using Anusol without great relief.  Rectal exam does not show any hemorrhoids possible area of maceration/fissure.  Did discuss conservative over-the-counter treatment and patient would like to be referred to gastroenterology as he has recurrent problems with hemorrhoids.  Referral placed Pending basic labs in office      Relevant Orders   CBC   Comprehensive metabolic panel   Anal fissure   Relevant Orders   Ambulatory referral to Gastroenterology     Musculoskeletal and Integument   Cellulitis of back except buttock - Primary    Looks to be some type of bite that has turned cellulitic to left lower lateral back.  Patient has a history of abscess and resultant cellulitis in the past.  Places indurated and erythemic.  Will start doxycycline 100 mg twice daily for 7 days.  If no improvement patient needs to follow-up in clinic      Relevant Medications   doxycycline (VIBRA-TABS) 100 MG tablet   Cracking skin    Patient describes "athlete's foot".  Upon exam feet are dry and he has cracking in the webs of between 3 and 4, 4 and 5 toes.  Discuss basic things to try over-the-counter inclusive of changing socks, keeping feet dry, padding dry after showers, and using lotion in between toes.  Patient states has been doing all this without great relief.  States also tried over-the-counter cream such as Lotrimin and powders without great relief patient would like  referral to podiatry.  At his behest referral placed      Relevant Orders   Ambulatory referral to Podiatry     Other   Episodic lightheadedness    Likely to be orthostatic in nature as patient drinks caffeine and very little water throughout the day.  This is infrequent.  Did encourage adequate hydration and will check basic labs in office.  Follow-up if recurrence      Relevant Orders   CBC   Comprehensive metabolic panel     Meds ordered this encounter  Medications   doxycycline (VIBRA-TABS) 100 MG tablet    Sig: Take 1 tablet (100 mg total) by mouth 2 (two) times daily for 7 days.    Dispense:  14 tablet    Refill:  0    Order Specific Question:   Supervising Provider    Answer:   Loura Pardon A [[id number]]   This visit occurred during the SARS-CoV-2 public health emergency.  Safety protocols were in place, including screening questions prior to the visit, additional usage of staff PPE, and extensive cleaning of exam room while observing appropriate contact time as indicated for disinfecting solutions.    Romilda Garret, NP

## 2021-03-31 NOTE — Assessment & Plan Note (Signed)
Likely to be orthostatic in nature as patient drinks caffeine and very little water throughout the day.  This is infrequent.  Did encourage adequate hydration and will check basic labs in office.  Follow-up if recurrence

## 2021-03-31 NOTE — Patient Instructions (Signed)
Nice to see you today Sent antibiotic to pharmacy If no better with medication be sure to follow up and be seen.

## 2021-04-01 LAB — COMPREHENSIVE METABOLIC PANEL
ALT: 28 U/L (ref 0–53)
AST: 22 U/L (ref 0–37)
Albumin: 4.3 g/dL (ref 3.5–5.2)
Alkaline Phosphatase: 49 U/L (ref 39–117)
BUN: 14 mg/dL (ref 6–23)
CO2: 29 mEq/L (ref 19–32)
Calcium: 9.1 mg/dL (ref 8.4–10.5)
Chloride: 103 mEq/L (ref 96–112)
Creatinine, Ser: 0.95 mg/dL (ref 0.40–1.50)
GFR: 102.55 mL/min (ref >60.00)
Glucose, Bld: 90 mg/dL (ref 70–99)
Potassium: 4 mEq/L (ref 3.5–5.1)
Sodium: 138 mEq/L (ref 135–145)
Total Bilirubin: 0.6 mg/dL (ref 0.2–1.2)
Total Protein: 7.2 g/dL (ref 6.0–8.3)

## 2021-04-01 LAB — CBC
HCT: 46 % (ref 39.0–52.0)
Hemoglobin: 15.4 g/dL (ref 13.0–17.0)
MCHC: 33.5 g/dL (ref 30.0–36.0)
MCV: 95.1 fl (ref 78.0–100.0)
Platelets: 245 10*3/uL (ref 150.0–400.0)
RBC: 4.84 Mil/uL (ref 4.22–5.81)
RDW: 13.2 % (ref 11.5–15.5)
WBC: 8.3 10*3/uL (ref 4.0–10.5)

## 2021-04-13 ENCOUNTER — Encounter: Payer: Self-pay | Admitting: Podiatry

## 2021-04-13 ENCOUNTER — Other Ambulatory Visit: Payer: Self-pay

## 2021-04-13 ENCOUNTER — Ambulatory Visit: Payer: No Typology Code available for payment source | Admitting: Podiatry

## 2021-04-13 DIAGNOSIS — B353 Tinea pedis: Secondary | ICD-10-CM | POA: Diagnosis not present

## 2021-04-13 MED ORDER — CLOTRIMAZOLE-BETAMETHASONE 1-0.05 % EX CREA: 1.0000 | TOPICAL_CREAM | Freq: Every day | CUTANEOUS | 0 refills | Status: DC

## 2021-04-13 NOTE — Progress Notes (Signed)
°  Subjective:  Patient ID: Mason Perez, male    DOB: [birth year],   MRN: [id number]  Chief Complaint  Patient presents with   Foot Pain    I have some blisters that are a quarter size and they pop and clear liquid comes out and the athlete's feet    36 y.o. male presents for concern of blisters and itching on bilateral feet. Relates he sweats a lot. Has tried topical anti-fungals with no help. Relates getting blisters often . Denies any other pedal complaints. Denies n/v/f/c.   History reviewed. No pertinent past medical history.  Objective:  Physical Exam: Vascular: DP/PT pulses 2/4 bilateral. CFT <3 seconds. Normal hair growth on digits. No edema.  Skin. No lacerations or abrasions bilateral feet. Bilateral feet small scaling and maceration noted in fourth webspace bilateral. Currently not blisters.  Musculoskeletal: MMT 5/5 bilateral lower extremities in DF, PF, Inversion and Eversion. Deceased ROM in DF of ankle joint.  Neurological: Sensation intact to light touch.   Assessment:   1. Tinea pedis of both feet      Plan:  Patient was evaluated and treated and all questions answered. -Examined patient -Discussed treatment options for tinea pedis.  -Prescirption for lotrisone provided.  -Patient to return as needed.    Lorenda Peck, DPM

## 2021-04-21 ENCOUNTER — Other Ambulatory Visit: Payer: Self-pay

## 2021-04-21 ENCOUNTER — Ambulatory Visit (INDEPENDENT_AMBULATORY_CARE_PROVIDER_SITE_OTHER): Payer: No Typology Code available for payment source | Admitting: Gastroenterology

## 2021-04-21 ENCOUNTER — Encounter: Payer: Self-pay | Admitting: Gastroenterology

## 2021-04-21 VITALS — BP 122/82 | HR 100 | Ht 71.0 in | Wt 241.4 lb

## 2021-04-21 DIAGNOSIS — K602 Anal fissure, unspecified: Secondary | ICD-10-CM | POA: Diagnosis not present

## 2021-04-21 MED ORDER — AMBULATORY NON FORMULARY MEDICATION: 1 refills | Status: DC

## 2021-04-21 NOTE — Patient Instructions (Addendum)
If you are age 38 or older, your body mass index should be between 23-30. Your Body mass index is 33.66 kg/m. If this is out of the aforementioned range listed, please consider follow up with your Primary Care Provider.  If you are age 29 or younger, your body mass index should be between 19-25. Your Body mass index is 33.66 kg/m. If this is out of the aformentioned range listed, please consider follow up with your Primary Care Provider.   ________________________________________________________  The [facility] providers would like to encourage you to use [facility] to communicate with providers for non-urgent requests or questions.  Due to long hold times on the telephone, sending your provider a message by [facility] may be a faster and more efficient way to get a response.  Please allow 48 business hours for a response.  Please remember that this is for non-urgent requests.  _______________________________________________________   We have sent the following medications to your pharmacy for you to pick up at your convenience: Nitroglycerin to [place] [facility] and the script has been given as well to you  [facility] information is below: Address: [address], [place], [place] [address]  [phone]  *Please DO NOT go directly from our office to pick up this medication! Give the pharmacy 1 day to process the prescription as this is compounded and takes time to make.  Follow up as needed  Please call with any questions or concerns.   How to Take a [employer] A sitz bath is a warm water bath that may be used to care for your rectum, genital area, or the area between your rectum and genitals (perineum). In a sitz bath, the water only comes up to your hips and covers your buttocks. A sitz bath may be done in a bathtub or with a portable sitz bath that fits over the toilet. Your health care provider may recommend a sitz bath to help: Relieve pain and discomfort after  delivering a baby. Relieve pain and itching from hemorrhoids or anal fissures. Relieve pain after certain surgeries. Relax muscles that are sore or tight. How to take a sitz bath Take 3-4 sitz baths a day, or as many as told by your health care provider. Bathtub sitz bath To take a sitz bath in a bathtub: Partially fill a bathtub with warm water. The water should be deep enough to cover your hips and buttocks when you are sitting in the tub. Follow your health care provider's instructions if you are told to put medicine in the water. Sit in the water. Open the tub drain a little, and leave it open during your bath. Turn on the warm water again, enough to replace the water that is draining out. Keep the water running throughout your bath. This helps keep the water at the right level and temperature. Soak in the water for 15-20 minutes, or as long as told by your health care provider. When you are done, be careful when you stand up. You may feel dizzy. After the sitz bath, pat yourself dry. Do not rub your skin to dry it.  Over-the-toilet sitz bath To take a sitz bath with an over-the-toilet basin: Follow the manufacturer's instructions. Fill the basin with warm water. Follow your health care provider's instructions if you were told to put medicine in the water. Sit on the seat. Make sure the water covers your buttocks and perineum. Soak in the water for 15-20 minutes, or as long as  told by your health care provider. After the sitz bath, pat yourself dry. Do not rub your skin to dry it. Clean and dry the basin between uses. Discard the basin if it cracks, or according to the manufacturer's instructions.  Contact a health care provider if: Your pain or itching gets worse. Do not continue with sitz baths if your symptoms get worse. You have new symptoms. Do not continue with sitz baths until you talk with your health care provider. Summary A sitz bath is a warm water bath in which the  water only comes up to your hips and covers your buttocks. A sitz bath may help relieve pain and discomfort after delivering a baby. It also may help with pain and itching from hemorrhoids or anal fissures, or pain after certain surgeries. It can also help to relax muscles that are sore or tight. Take 3-4 sitz baths a day, or as many as told by your health care provider. Soak in the water for 15-20 minutes. Do not continue with sitz baths if your symptoms get worse. This information is not intended to replace advice given to you by your health care provider. Make sure you discuss any questions you have with your health care provider. Document Revised: 10/30/2019 Document Reviewed: 10/30/2019 Elsevier Patient Education  2022 [employer].  It was a pleasure to see you today!  Mason Perez, D.O.

## 2021-04-21 NOTE — Progress Notes (Signed)
Chief Complaint: Hematochezia, rectal itching/burning   Referring Provider:    Michela Pitcher, NP   HPI:     Mason Perez is a 36 y.o. male referred to the [facility] for evaluation of hematochezia and rectal pain.    Symptoms have been present intermittently for the last year or so, described as rectal itching, burning, discomfort.  Sxs worsening over last 4 weeks or so.  Did have 2 episodes of BRB on tissue paper over the last 2 weeks. No appreciable improvement with Anusol suppositories, Preparation H cream etc.  No associated constipation, straining to have BM.  Was seen in [facility] office on 03/31/2021 for this issue.  Exam with anal fissure in posterior position without hemorrhoids.  No new meds prescribed at that time and referred to GI.  Normal CBC, CMP.  Separately, wants to know if his bowel habits are "abnormal", as he tends to have 5 postprandial BM/day.  Unchanged for as long as he can remember.  No associated diarrhea or constipation.  No abdominal pain.    No previous EGD or colonoscopy.  No known family history of CRC, GI malignancy, liver disease, pancreatic disease, or IBD.   History reviewed. No pertinent past medical history.   Past Surgical History:  Procedure Laterality Date   HERNIA REPAIR     Family History  Problem Relation Age of Onset   Diabetes Mother    Diabetes Father    Liver disease Father    Drug abuse Maternal Grandmother    Drug abuse Maternal Grandfather    Stomach cancer Maternal Uncle    Colon cancer Neg Hx    Rectal cancer Neg Hx    Social History   Tobacco Use   Smoking status: Every Day    Packs/day: 0.50    Types: Cigarettes   Smokeless tobacco: Never  Vaping Use   Vaping Use: Never used  Substance Use Topics   Alcohol use: Yes    Alcohol/week: 1.0 standard drink    Types: 1 Shots of liquor per week    Comment: social   Drug use: No   Current Outpatient Medications  Medication Sig Dispense  Refill   clotrimazole-betamethasone (LOTRISONE) cream Apply 1 application topically daily. 30 g 0   hydrocortisone (ANUSOL-HC) 2.5 % rectal cream Place 1 application rectally 2 (two) times daily. 30 g 0   atorvastatin (LIPITOR) 40 MG tablet Take 1 tablet (40 mg total) by mouth daily. (Patient not taking: Reported on 03/31/2021) 90 tablet 3   No current facility-administered medications for this visit.   No Known Allergies   Review of Systems: All systems reviewed and negative except where noted in HPI.     Physical Exam:    Wt Readings from Last 3 Encounters:  04/21/21 241 lb 6 oz (109.5 kg)  03/31/21 239 lb 2 oz (108.5 kg)  04/09/20 225 lb 9.6 oz (102.3 kg)    BP 122/82    Pulse 100    Ht 5\' 11"  (1.803 m)    Wt 241 lb 6 oz (109.5 kg)    SpO2 98%    BMI 33.66 kg/m  Constitutional:  Pleasant, in no acute distress. Psychiatric: Normal mood and affect. Behavior is normal. Cardiovascular: Normal rate, regular rhythm. No edema Pulmonary/chest: Effort normal and breath sounds normal. No wheezing, rales or rhonchi. Abdominal: Soft, nondistended, nontender. Bowel sounds active throughout. There are no masses palpable. No hepatomegaly. Neurological: Alert  and oriented to person place and time. Skin: Skin is warm and dry. No rashes noted. Rectal exam: Sensation intact and preserved anal wink.  Anal fissure in the 8 o'clock position (when laying left lateral) with TTP.  No external hemorrhoids.  Normal sphincter tone. No palpable mass. No blood on the exam glove.  Limited anoscopy without appreciable internal hemorrhoids, but exquisite TTP at fissure site.  (Chaperone: Curlene Labrum, CMA).     ASSESSMENT AND PLAN;   1) Anal Fissure: Physical exam notable for external anal fissure in the 8:00 position. Will treat as below:  - Start topical NTG 0.125%, apply a small, pea-sized amount to the affected area BID for 6-8 weeks.  - We discussed the ADR of headache, and if patient does experience,  to call me and will change and make pharmacy request to compound topical CCB (topical nifedipine 0.2-0.3% applied 2-4 times daily; unfortunately, the CCB also carries an ADR of headache in 5-12%). Additionally, cautioned to avoid strenuous activity within 30 minutes of application  - Start fiber supplement for a goal of regular, soft, stools without straining to have a bowel movement.  - Sitz bath with warm water for 10-15 minutes 2-3 times daily; directed to [employer] available online or at [employer]; ensure to dry area afterwards - If no improvement with appropriate trial of therapy, will either change meds or can consider endosocpic evaluation   2) Increase stool frequency - Lifelong history of 4-5 BM/day.  Unchanged.  Possibly a dietary component, as the also c/o being lactose intolerant but does not avoid dairy products. - Would benefit from low FODMAP diet - Trial of avoiding dairy for diagnostic intent - Start fiber supplement - If symptoms persist or become increasingly bothersome, can consider colonoscopy to evaluate for mucosal/luminal pathology    Lavena Bullion, DO, FACG  04/21/2021, 3:33 PM   Libby Maw,*

## 2022-03-23 ENCOUNTER — Ambulatory Visit: Payer: No Typology Code available for payment source | Admitting: Family Medicine

## 2022-03-23 ENCOUNTER — Encounter: Payer: Self-pay | Admitting: Family Medicine

## 2022-03-23 VITALS — BP 134/86 | HR 90 | Temp 97.1°F | Ht 71.0 in | Wt 240.2 lb

## 2022-03-23 DIAGNOSIS — B356 Tinea cruris: Secondary | ICD-10-CM

## 2022-03-23 DIAGNOSIS — L720 Epidermal cyst: Secondary | ICD-10-CM | POA: Diagnosis not present

## 2022-03-23 DIAGNOSIS — R590 Localized enlarged lymph nodes: Secondary | ICD-10-CM | POA: Diagnosis not present

## 2022-03-23 LAB — CBC WITH DIFFERENTIAL/PLATELET
Basophils Absolute: 0 10*3/uL (ref 0.0–0.1)
Basophils Relative: 0.5 % (ref 0.0–3.0)
Eosinophils Absolute: 0.2 10*3/uL (ref 0.0–0.7)
Eosinophils Relative: 2.7 % (ref 0.0–5.0)
HCT: 43.7 % (ref 39.0–52.0)
Hemoglobin: 15.3 g/dL (ref 13.0–17.0)
Lymphocytes Relative: 27.2 % (ref 12.0–46.0)
Lymphs Abs: 2.3 10*3/uL (ref 0.7–4.0)
MCHC: 35 g/dL (ref 30.0–36.0)
MCV: 95.2 fl (ref 78.0–100.0)
Monocytes Absolute: 0.5 10*3/uL (ref 0.1–1.0)
Monocytes Relative: 5.5 % (ref 3.0–12.0)
Neutro Abs: 5.5 10*3/uL (ref 1.4–7.7)
Neutrophils Relative %: 64.1 % (ref 43.0–77.0)
Platelets: 218 10*3/uL (ref 150.0–400.0)
RBC: 4.59 Mil/uL (ref 4.22–5.81)
RDW: 13.2 % (ref 11.5–15.5)
WBC: 8.6 10*3/uL (ref 4.0–10.5)

## 2022-03-23 MED ORDER — DOXYCYCLINE HYCLATE 100 MG PO TABS: 100.0000 mg | ORAL_TABLET | Freq: Two times a day (BID) | ORAL | 0 refills | Status: AC

## 2022-03-23 NOTE — Progress Notes (Signed)
Established Patient Office Visit   Subjective:  Patient ID: Mason Perez, male    DOB: 1985-09-13  Age: 37 y.o. MRN: [id number]  Chief Complaint  Patient presents with   Cyst    Noticed knot in groan area little tender to touch x 2 days.     HPI Encounter Diagnoses  Name Primary?   Tinea cruris Yes   Inclusion cyst    Inguinal lymphadenopathy    For evaluation of 2 tender spots in his groin area.  He has been successfully treating a pruritic rash in his inner thighs for the last few weeks with an antifungal spray.  Rash is cleared.  He has found a tender spot on the right and the left.   Review of Systems  Constitutional: Negative.   HENT: Negative.    Eyes:  Negative for blurred vision, discharge and redness.  Respiratory: Negative.    Cardiovascular: Negative.   Gastrointestinal:  Negative for abdominal pain.  Genitourinary: Negative.   Musculoskeletal: Negative.  Negative for myalgias.  Skin:  Positive for itching and rash.  Neurological:  Negative for tingling, loss of consciousness and weakness.  Endo/Heme/Allergies:  Negative for polydipsia.     Current Outpatient Medications:    doxycycline (VIBRA-TABS) 100 MG tablet, Take 1 tablet (100 mg total) by mouth 2 (two) times daily for 10 days., Disp: 20 tablet, Rfl: 0   GOODSENSE IBUPROFEN 200 MG tablet, Take 200 mg by mouth., Disp: , Rfl:    AMBULATORY NON FORMULARY MEDICATION, Medication Name: Nitroglycerin ointment 0.125% Apply 1 peasize amount twice daily for 6-8 weeks. (Patient not taking: Reported on 03/23/2022), Disp: 30 g, Rfl: 1   atorvastatin (LIPITOR) 40 MG tablet, Take 1 tablet (40 mg total) by mouth daily. (Patient not taking: Reported on 03/31/2021), Disp: 90 tablet, Rfl: 3   clotrimazole-betamethasone (LOTRISONE) cream, Apply 1 application topically daily. (Patient not taking: Reported on 03/23/2022), Disp: 30 g, Rfl: 0   hydrocortisone (ANUSOL-HC) 2.5 % rectal cream, Place 1 application rectally 2 (two)  times daily. (Patient not taking: Reported on 03/23/2022), Disp: 30 g, Rfl: 0   Objective:     BP 134/86 (BP Location: Right Arm, Patient Position: Sitting, Cuff Size: Large)   Pulse 90   Temp (!) 97.1 F (36.2 C) (Temporal)   Ht 5\' 11"  (1.803 m)   Wt 240 lb 3.2 oz (109 kg)   SpO2 98%   BMI 33.50 kg/m    GU exam shows a circumcised male without lesions or rashes on the penis.  Both testicles are descended.  There are no masses or lesions.  There are no hernias.  There is no rash on the inner thigh.  In the right inguinal area is a 0.5 x 1 cm mass.  On the left he is a cystic mass with an evolving pustule.   No results found for any visits on 03/23/22.    The ASCVD Risk score (Arnett DK, et al., 2019) failed to calculate for the following reasons:   The 2019 ASCVD risk score is only valid for ages 54 to 88    Assessment & Plan:   Tinea cruris  Inclusion cyst -     Doxycycline Hyclate; Take 1 tablet (100 mg total) by mouth 2 (two) times daily for 10 days.  Dispense: 20 tablet; Refill: 0  Inguinal lymphadenopathy -     CBC with Differential/Platelet    Return if symptoms worsen or fail to improve.  Mass on right is an inguinal node.  It is not enlarged.  It is not inflamed.  He will return if it increases in size.  Doxycycline 100 twice daily for inclusion cyst.  Continue antifungal spray for 2 weeks.  Information was given on inclusion cyst and tinea cruris. Libby Maw, MD

## 2022-09-01 ENCOUNTER — Encounter: Payer: No Typology Code available for payment source | Admitting: Family Medicine

## 2022-09-01 ENCOUNTER — Telehealth: Payer: Self-pay | Admitting: Family Medicine

## 2022-09-01 NOTE — Telephone Encounter (Signed)
Ns 7/5 no reason available letter sent 

## 2022-09-20 NOTE — Telephone Encounter (Signed)
1st no show, fee waived, letter sent to reschedule/notify of policy Pt rescheduled for 10/02/22

## 2022-10-02 ENCOUNTER — Ambulatory Visit: Payer: No Typology Code available for payment source | Admitting: Family Medicine

## 2022-10-02 ENCOUNTER — Encounter: Payer: Self-pay | Admitting: Family Medicine

## 2022-10-02 VITALS — BP 118/82 | HR 77 | Temp 97.2°F | Ht 71.0 in | Wt 243.4 lb

## 2022-10-02 DIAGNOSIS — Z0000 Encounter for general adult medical examination without abnormal findings: Secondary | ICD-10-CM | POA: Diagnosis not present

## 2022-10-02 DIAGNOSIS — L309 Dermatitis, unspecified: Secondary | ICD-10-CM | POA: Diagnosis not present

## 2022-10-02 DIAGNOSIS — F17209 Nicotine dependence, unspecified, with unspecified nicotine-induced disorders: Secondary | ICD-10-CM | POA: Diagnosis not present

## 2022-10-02 DIAGNOSIS — Z13220 Encounter for screening for lipoid disorders: Secondary | ICD-10-CM

## 2022-10-02 DIAGNOSIS — Z131 Encounter for screening for diabetes mellitus: Secondary | ICD-10-CM | POA: Diagnosis not present

## 2022-10-02 LAB — COMPREHENSIVE METABOLIC PANEL
ALT: 28 U/L (ref 0–53)
AST: 21 U/L (ref 0–37)
Albumin: 4.1 g/dL (ref 3.5–5.2)
Alkaline Phosphatase: 52 U/L (ref 39–117)
BUN: 10 mg/dL (ref 6–23)
CO2: 27 mEq/L (ref 19–32)
Calcium: 9 mg/dL (ref 8.4–10.5)
Chloride: 106 mEq/L (ref 96–112)
Creatinine, Ser: 0.93 mg/dL (ref 0.40–1.50)
GFR: 104.09 mL/min (ref >60.00)
Glucose, Bld: 86 mg/dL (ref 70–99)
Potassium: 4.1 mEq/L (ref 3.5–5.1)
Sodium: 139 mEq/L (ref 135–145)
Total Bilirubin: 0.7 mg/dL (ref 0.2–1.2)
Total Protein: 6.7 g/dL (ref 6.0–8.3)

## 2022-10-02 LAB — URINALYSIS, ROUTINE W REFLEX MICROSCOPIC
Bilirubin Urine: NEGATIVE
Hgb urine dipstick: NEGATIVE
Ketones, ur: NEGATIVE
Leukocytes,Ua: NEGATIVE
Nitrite: NEGATIVE
Specific Gravity, Urine: 1.02 (ref 1.000–1.030)
Total Protein, Urine: NEGATIVE
Urine Glucose: NEGATIVE
Urobilinogen, UA: 0.2 (ref 0.0–1.0)
WBC, UA: NONE SEEN (ref 0–?)
pH: 7 (ref 5.0–8.0)

## 2022-10-02 LAB — CBC
HCT: 45.3 % (ref 39.0–52.0)
Hemoglobin: 15.2 g/dL (ref 13.0–17.0)
MCHC: 33.4 g/dL (ref 30.0–36.0)
MCV: 96.9 fl (ref 78.0–100.0)
Platelets: 227 10*3/uL (ref 150.0–400.0)
RBC: 4.68 Mil/uL (ref 4.22–5.81)
RDW: 13.1 % (ref 11.5–15.5)
WBC: 6.8 10*3/uL (ref 4.0–10.5)

## 2022-10-02 LAB — LIPID PANEL
Cholesterol: 205 mg/dL — ABNORMAL HIGH (ref 0–200)
HDL: 36.8 mg/dL — ABNORMAL LOW (ref >39.00)
LDL Cholesterol: 130 mg/dL — ABNORMAL HIGH (ref 0–99)
NonHDL: 168.46
Total CHOL/HDL Ratio: 6
Triglycerides: 192 mg/dL — ABNORMAL HIGH (ref 0.0–149.0)
VLDL: 38.4 mg/dL (ref 0.0–40.0)

## 2022-10-02 LAB — HEMOGLOBIN A1C: Hgb A1c MFr Bld: 5.7 % (ref 4.6–6.5)

## 2022-10-02 MED ORDER — ZINC OXIDE 20 % EX OINT: 1.0000 | TOPICAL_OINTMENT | CUTANEOUS | 5 refills | Status: DC | PRN

## 2022-10-02 NOTE — Progress Notes (Signed)
Established Patient Office Visit   Subjective:  Patient ID: Mason Perez, male    DOB: 1985/05/31  Age: 37 y.o. MRN: [id number]  Chief Complaint  Patient presents with   Annual Exam    CPE. Pt is fasting. Pt need Rx for Jock Itch. Ointments does not work.     HPI Encounter Diagnoses  Name Primary?   Tobacco use disorder, continuous Yes   Screening for diabetes mellitus    Screening for hyperlipidemia    Healthcare maintenance    Dermatitis    Have an for physical exam today.  He is active physically by walking.  He gets about 200 minutes a week.  He does have regular dental care.  He lives with his wife and 5 kids.  He enjoys skydiving.  He gets an irritation in his inner thighs from the harness used that connects him with the parachute.  He is smoking about half a pack a day.  It has been difficult for him to quit.  He has tried the gum.  Chart review shows an A1c of 5.7 and an LDL of 159.  His brother has diabetes.   Review of Systems  Constitutional: Negative.   HENT: Negative.    Eyes:  Negative for blurred vision, discharge and redness.  Respiratory: Negative.    Cardiovascular: Negative.   Gastrointestinal:  Negative for abdominal pain.  Genitourinary: Negative.   Musculoskeletal: Negative.  Negative for myalgias.  Skin:  Negative for rash.  Neurological:  Negative for tingling, loss of consciousness and weakness.  Endo/Heme/Allergies:  Negative for polydipsia.      10/02/2022   11:49 AM 10/02/2022   11:48 AM 04/09/2020    2:12 PM  Depression screen PHQ 2/9  Decreased Interest 0 0 0  Down, Depressed, Hopeless 0 0 0  PHQ - 2 Score 0 0 0  Altered sleeping 0 0   Tired, decreased energy 1 0   Change in appetite 0 0   Feeling bad or failure about yourself  0 0   Trouble concentrating 0 0   Moving slowly or fidgety/restless 0 0   Suicidal thoughts 0 0   PHQ-9 Score 1 0   Difficult doing work/chores Not difficult at all Not difficult at all        Current  Outpatient Medications:    GOODSENSE IBUPROFEN 200 MG tablet, Take 200 mg by mouth., Disp: , Rfl:    zinc oxide (MEIJER ZINC OXIDE) 20 % ointment, Apply 1 Application topically as needed for irritation., Disp: 56.7 g, Rfl: 5   AMBULATORY NON FORMULARY MEDICATION, Medication Name: Nitroglycerin ointment 0.125% Apply 1 peasize amount twice daily for 6-8 weeks. (Patient not taking: Reported on 03/23/2022), Disp: 30 g, Rfl: 1   atorvastatin (LIPITOR) 40 MG tablet, Take 1 tablet (40 mg total) by mouth daily. (Patient not taking: Reported on 03/31/2021), Disp: 90 tablet, Rfl: 3   clotrimazole-betamethasone (LOTRISONE) cream, Apply 1 application topically daily. (Patient not taking: Reported on 03/23/2022), Disp: 30 g, Rfl: 0   hydrocortisone (ANUSOL-HC) 2.5 % rectal cream, Place 1 application rectally 2 (two) times daily. (Patient not taking: Reported on 03/23/2022), Disp: 30 g, Rfl: 0   Objective:     BP 118/82   Pulse 77   Temp (!) 97.2 F (36.2 C)   Ht 5\' 11"  (1.803 m)   Wt 243 lb 6.4 oz (110.4 kg)   SpO2 95%   BMI 33.95 kg/m    Physical Exam Constitutional:  General: He is not in acute distress.    Appearance: Normal appearance. He is not ill-appearing, toxic-appearing or diaphoretic.  HENT:     Head: Normocephalic and atraumatic.     Right Ear: Tympanic membrane, ear canal and external ear normal.     Left Ear: Tympanic membrane, ear canal and external ear normal.     Mouth/Throat:     Mouth: Mucous membranes are moist.     Pharynx: Oropharynx is clear. No oropharyngeal exudate or posterior oropharyngeal erythema.  Eyes:     General: No scleral icterus.       Right eye: No discharge.        Left eye: No discharge.     Extraocular Movements: Extraocular movements intact.     Conjunctiva/sclera: Conjunctivae normal.     Pupils: Pupils are equal, round, and reactive to light.  Cardiovascular:     Rate and Rhythm: Normal rate and regular rhythm.  Pulmonary:     Effort: Pulmonary  effort is normal. No respiratory distress.     Breath sounds: Normal breath sounds.  Abdominal:     General: Bowel sounds are normal.     Tenderness: There is no abdominal tenderness. There is no guarding.     Hernia: There is no hernia in the left inguinal area or right inguinal area.  Genitourinary:    Pubic Area: No rash.      Penis: No hypospadias, erythema, tenderness, discharge, swelling or lesions.      Testes:        Right: Mass, tenderness or swelling not present. Right testis is descended.        Left: Mass, tenderness or swelling not present. Left testis is descended.     Epididymis:     Right: Not inflamed or enlarged. No mass or tenderness.     Left: Not inflamed or enlarged. No mass or tenderness.  Musculoskeletal:     Cervical back: No rigidity or tenderness.  Lymphadenopathy:     Cervical: No cervical adenopathy.     Lower Body: No right inguinal adenopathy. No left inguinal adenopathy.  Skin:    General: Skin is warm and dry.     Comments: No rash in groin area, inner thighs, buttocks or anus.  Neurological:     Mental Status: He is alert and oriented to person, place, and time.  Psychiatric:        Mood and Affect: Mood normal.        Behavior: Behavior normal.      No results found for any visits on 10/02/22.    The ASCVD Risk score (Arnett DK, et al., 2019) failed to calculate for the following reasons:   The 2019 ASCVD risk score is only valid for ages 54 to 80    Assessment & Plan:   Tobacco use disorder, continuous -     Ambulatory referral to Smoking Cessation  Screening for diabetes mellitus -     Comprehensive metabolic panel -     Hemoglobin A1c  Screening for hyperlipidemia -     Comprehensive metabolic panel -     Lipid panel  Healthcare maintenance -     CBC -     Urinalysis, Routine w reflex microscopic  Dermatitis -     Zinc Oxide; Apply 1 Application topically as needed for irritation.  Dispense: 56.7 g; Refill:  5    Return in about 3 months (around 01/02/2023).  Encouraged ongoing exercise and weight loss.  Information was given  on health maintenance and disease prevention.  Information given on managing the challenges of quitting smoking.  Referred to smoking cessation clinic.  Will try zinc oxide for inguinal chafe.   Mliss Sax, MD

## 2022-11-21 ENCOUNTER — Encounter: Payer: Self-pay | Admitting: Internal Medicine

## 2022-11-21 ENCOUNTER — Ambulatory Visit: Payer: No Typology Code available for payment source | Admitting: Internal Medicine

## 2022-11-21 VITALS — BP 108/70 | HR 77 | Temp 97.6°F | Ht 71.0 in | Wt 238.6 lb

## 2022-11-21 DIAGNOSIS — B356 Tinea cruris: Secondary | ICD-10-CM | POA: Diagnosis not present

## 2022-11-21 DIAGNOSIS — M25541 Pain in joints of right hand: Secondary | ICD-10-CM

## 2022-11-21 MED ORDER — MELOXICAM 15 MG PO TABS
15.0000 mg | ORAL_TABLET | Freq: Every day | ORAL | 0 refills | Status: AC
Start: 2022-11-21 — End: ?

## 2022-11-21 MED ORDER — FLUCONAZOLE 150 MG PO TABS
150.0000 mg | ORAL_TABLET | ORAL | 0 refills | Status: AC
Start: 2022-11-21 — End: ?
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
## 2022-11-21 NOTE — Patient Instructions (Signed)
Do not take ibuprofen while taking meloxicam  Use ice packs to area 2-4 times a day

## 2023-01-22 ENCOUNTER — Ambulatory Visit: Payer: No Typology Code available for payment source | Admitting: Internal Medicine

## 2023-03-12 ENCOUNTER — Encounter: Payer: Self-pay | Admitting: Family Medicine

## 2023-03-12 ENCOUNTER — Ambulatory Visit: Payer: No Typology Code available for payment source | Admitting: Family Medicine

## 2023-03-12 VITALS — BP 104/78 | HR 87 | Temp 97.3°F | Ht 71.0 in | Wt 237.2 lb

## 2023-03-12 DIAGNOSIS — L723 Sebaceous cyst: Secondary | ICD-10-CM | POA: Diagnosis not present

## 2023-03-12 NOTE — Progress Notes (Signed)
 Established Patient Office Visit   Subjective:  Patient ID: Mason Perez, male    DOB: 03/18/1985  Age: 37 y.o. MRN: [id number]  Chief Complaint  Patient presents with   Groin Swelling    2 nodules on is left testicle that feels fatty.     HPI Encounter Diagnoses  Name Primary?   Scrotal sebaceous cyst Yes   Here today out of concern for new growths on his scrotum.  They are not painful and have not been draining.  They do itch.  Regarding his A1c slightly elevated into the prediabetic range, he has been exercising and losing weight.  He is taking this very seriously.   Review of Systems  Constitutional: Negative.   HENT: Negative.    Eyes:  Negative for blurred vision, discharge and redness.  Respiratory: Negative.    Cardiovascular: Negative.   Gastrointestinal:  Negative for abdominal pain.  Genitourinary: Negative.   Musculoskeletal: Negative.  Negative for myalgias.  Skin:  Negative for rash.  Neurological:  Negative for tingling, loss of consciousness and weakness.  Endo/Heme/Allergies:  Negative for polydipsia.     Current Outpatient Medications:    AMBULATORY NON FORMULARY MEDICATION, Medication Name: Nitroglycerin  ointment 0.125% Apply 1 peasize amount twice daily for 6-8 weeks. (Patient not taking: Reported on 03/23/2022), Disp: 30 g, Rfl: 1   atorvastatin  (LIPITOR) 40 MG tablet, Take 1 tablet (40 mg total) by mouth daily., Disp: 90 tablet, Rfl: 3   clotrimazole -betamethasone  (LOTRISONE ) cream, Apply 1 application topically daily. (Patient not taking: Reported on 03/23/2022), Disp: 30 g, Rfl: 0   fluconazole  (DIFLUCAN ) 150 MG tablet, Take 1 tablet (150 mg total) by mouth once a week., Disp: 3 tablet, Rfl: 0   GOODSENSE IBUPROFEN  200 MG tablet, Take 200 mg by mouth. (Patient not taking: Reported on 11/21/2022), Disp: , Rfl:    hydrocortisone  (ANUSOL -HC) 2.5 % rectal cream, Place 1 application rectally 2 (two) times daily. (Patient not taking: Reported on  03/23/2022), Disp: 30 g, Rfl: 0   meloxicam  (MOBIC ) 15 MG tablet, Take 1 tablet (15 mg total) by mouth daily., Disp: 30 tablet, Rfl: 0   zinc  oxide (MEIJER ZINC  OXIDE) 20 % ointment, Apply 1 Application topically as needed for irritation. (Patient not taking: Reported on 11/21/2022), Disp: 56.7 g, Rfl: 5   Objective:     BP 104/78 (Cuff Size: Large)   Pulse 87   Temp (!) 97.3 F (36.3 C)   Ht 5' 11 (1.803 m)   Wt 237 lb 3.2 oz (107.6 kg)   SpO2 97%   BMI 33.08 kg/m  Wt Readings from Last 3 Encounters:  03/12/23 237 lb 3.2 oz (107.6 kg)  11/21/22 238 lb 9.6 oz (108.2 kg)  10/02/22 243 lb 6.4 oz (110.4 kg)      Physical Exam Constitutional:      General: He is not in acute distress.    Appearance: Normal appearance. He is not ill-appearing, toxic-appearing or diaphoretic.  HENT:     Head: Normocephalic and atraumatic.     Right Ear: External ear normal.     Left Ear: External ear normal.  Eyes:     General: No scleral icterus.       Right eye: No discharge.        Left eye: No discharge.     Extraocular Movements: Extraocular movements intact.     Conjunctiva/sclera: Conjunctivae normal.  Pulmonary:     Effort: Pulmonary effort is normal. No respiratory distress.  Abdominal:  Hernia: There is no hernia in the left inguinal area or right inguinal area.  Genitourinary:    Penis: Circumcised. No hypospadias, erythema, tenderness, discharge, swelling or lesions.      Testes:        Right: Mass, tenderness or swelling not present. Right testis is descended.        Left: Mass, tenderness or swelling not present. Left testis is descended.     Epididymis:     Right: Not inflamed or enlarged.     Left: Not inflamed or enlarged.  Lymphadenopathy:     Lower Body: No right inguinal adenopathy. No left inguinal adenopathy.  Skin:    General: Skin is warm and dry.       Neurological:     Mental Status: He is alert and oriented to person, place, and time.  Psychiatric:         Mood and Affect: Mood normal.        Behavior: Behavior normal.      No results found for any visits on 03/12/23.    The ASCVD Risk score (Arnett DK, et al., 2019) failed to calculate for the following reasons:   The 2019 ASCVD risk score is only valid for ages 67 to 53    Assessment & Plan:   Scrotal sebaceous cyst -     Ambulatory referral to Urology  Explained the benign nature of the cyst.  He would still like to see the urologist for evaluation of possible excision.  Return in about 7 months (around 10/10/2023), or Continue weight loss efforts, for annual physical.    Elsie Sim Lent, MD

## 2023-03-20 ENCOUNTER — Ambulatory Visit: Payer: No Typology Code available for payment source | Admitting: Urology

## 2023-03-20 NOTE — Progress Notes (Deleted)
   Assessment: No diagnosis found.   Plan: ***  Chief Complaint: No chief complaint on file.   History of Present Illness:  Mason Perez is a 38 y.o. male who is seen in consultation from Mliss Sax, MD for evaluation of scrotal sebaceous cysts.   Past Medical History:  No past medical history on file.  Past Surgical History:  Past Surgical History:  Procedure Laterality Date   HERNIA REPAIR      Allergies:  No Known Allergies  Family History:  Family History  Problem Relation Age of Onset   Diabetes Mother    Diabetes Father    Liver disease Father    Drug abuse Maternal Grandmother    Drug abuse Maternal Grandfather    Stomach cancer Maternal Uncle    Colon cancer Neg Hx    Rectal cancer Neg Hx     Social History:  Social History   Tobacco Use   Smoking status: Every Day    Current packs/day: 0.50    Types: Cigarettes   Smokeless tobacco: Never   Tobacco comments:      Referred to smoking cessation clinic.  Vaping Use   Vaping status: Never Used  Substance Use Topics   Alcohol use: Yes    Alcohol/week: 1.0 standard drink of alcohol    Types: 1 Shots of liquor per week    Comment: social   Drug use: No    Review of symptoms:  Constitutional:  Negative for unexplained weight loss, night sweats, fever, chills ENT:  Negative for nose bleeds, sinus pain, painful swallowing CV:  Negative for chest pain, shortness of breath, exercise intolerance, palpitations, loss of consciousness Resp:  Negative for cough, wheezing, shortness of breath GI:  Negative for nausea, vomiting, diarrhea, bloody stools GU:  Positives noted in HPI; otherwise negative for gross hematuria, dysuria, urinary incontinence Neuro:  Negative for seizures, poor balance, limb weakness, slurred speech Psych:  Negative for lack of energy, depression, anxiety Endocrine:  Negative for polydipsia, polyuria, symptoms of hypoglycemia (dizziness, hunger, sweating) Hematologic:   Negative for anemia, purpura, petechia, prolonged or excessive bleeding, use of anticoagulants  Allergic:  Negative for difficulty breathing or choking as a result of exposure to anything; no shellfish allergy; no allergic response (rash/itch) to materials, foods  Physical exam: There were no vitals taken for this visit. GENERAL APPEARANCE:  Well appearing, well developed, well nourished, NAD HEENT: Atraumatic, Normocephalic, oropharynx clear. NECK: Supple without lymphadenopathy or thyromegaly. LUNGS: Clear to auscultation bilaterally. HEART: Regular Rate and Rhythm without murmurs, gallops, or rubs. ABDOMEN: Soft, non-tender, No Masses. EXTREMITIES: Moves all extremities well.  Without clubbing, cyanosis, or edema. NEUROLOGIC:  Alert and oriented x 3, normal gait, CN II-XII grossly intact.  MENTAL STATUS:  Appropriate. BACK:  Non-tender to palpation.  No CVAT SKIN:  Warm, dry and intact.    Results: No results found for this or any previous visit (from the past 24 hours).

## 2023-03-21 ENCOUNTER — Ambulatory Visit: Payer: No Typology Code available for payment source | Admitting: Urology

## 2023-03-21 NOTE — Progress Notes (Deleted)
   Assessment: No diagnosis found.   Plan: ***  Chief Complaint: No chief complaint on file.   History of Present Illness:  Mason Perez is a 38 y.o. male who is seen in consultation from Mliss Sax, MD for evaluation of scrotal cyst.   Past Medical History:  No past medical history on file.  Past Surgical History:  Past Surgical History:  Procedure Laterality Date   HERNIA REPAIR      Allergies:  No Known Allergies  Family History:  Family History  Problem Relation Age of Onset   Diabetes Mother    Diabetes Father    Liver disease Father    Drug abuse Maternal Grandmother    Drug abuse Maternal Grandfather    Stomach cancer Maternal Uncle    Colon cancer Neg Hx    Rectal cancer Neg Hx     Social History:  Social History   Tobacco Use   Smoking status: Every Day    Current packs/day: 0.50    Types: Cigarettes   Smokeless tobacco: Never   Tobacco comments:      Referred to smoking cessation clinic.  Vaping Use   Vaping status: Never Used  Substance Use Topics   Alcohol use: Yes    Alcohol/week: 1.0 standard drink of alcohol    Types: 1 Shots of liquor per week    Comment: social   Drug use: No    Review of symptoms:  Constitutional:  Negative for unexplained weight loss, night sweats, fever, chills ENT:  Negative for nose bleeds, sinus pain, painful swallowing CV:  Negative for chest pain, shortness of breath, exercise intolerance, palpitations, loss of consciousness Resp:  Negative for cough, wheezing, shortness of breath GI:  Negative for nausea, vomiting, diarrhea, bloody stools GU:  Positives noted in HPI; otherwise negative for gross hematuria, dysuria, urinary incontinence Neuro:  Negative for seizures, poor balance, limb weakness, slurred speech Psych:  Negative for lack of energy, depression, anxiety Endocrine:  Negative for polydipsia, polyuria, symptoms of hypoglycemia (dizziness, hunger, sweating) Hematologic:  Negative  for anemia, purpura, petechia, prolonged or excessive bleeding, use of anticoagulants  Allergic:  Negative for difficulty breathing or choking as a result of exposure to anything; no shellfish allergy; no allergic response (rash/itch) to materials, foods  Physical exam: There were no vitals taken for this visit. GENERAL APPEARANCE:  Well appearing, well developed, well nourished, NAD HEENT: Atraumatic, Normocephalic, oropharynx clear. NECK: Supple without lymphadenopathy or thyromegaly. LUNGS: Clear to auscultation bilaterally. HEART: Regular Rate and Rhythm without murmurs, gallops, or rubs. ABDOMEN: Soft, non-tender, No Masses. EXTREMITIES: Moves all extremities well.  Without clubbing, cyanosis, or edema. NEUROLOGIC:  Alert and oriented x 3, normal gait, CN II-XII grossly intact.  MENTAL STATUS:  Appropriate. BACK:  Non-tender to palpation.  No CVAT SKIN:  Warm, dry and intact.    Results: No results found for this or any previous visit (from the past 24 hours).

## 2023-03-29 ENCOUNTER — Encounter: Payer: No Typology Code available for payment source | Admitting: Urology

## 2023-04-04 ENCOUNTER — Telehealth: Payer: Self-pay | Admitting: Family Medicine

## 2023-04-04 ENCOUNTER — Encounter: Payer: No Typology Code available for payment source | Admitting: Urology

## 2023-04-04 NOTE — Telephone Encounter (Signed)
 [facility] [address] [facility] [address] [facility] [place] [address] [place] [place], [place] [address]  0.0 mi

## 2023-04-04 NOTE — Telephone Encounter (Signed)
 Copied from CRM [id number]. Topic: Referral - Status >> Apr 04, 2023 10:30 AM Graeme ORN wrote: Reason for CRM: Patient called states he was referred to urologist by provider and they scheduled appt. He got to appt and it was the wrong location - we went to location listed in MyChart and then he went to the correct location and they were unable to see him or get him rescheduled. Wanted to know if he could be referred to a different urologist. Does not want to go back to that practice. Thank You

## 2024-02-04 ENCOUNTER — Ambulatory Visit: Payer: Self-pay

## 2024-02-04 NOTE — Telephone Encounter (Signed)
 FYI Only or Action Required?: Action required by provider: request for appointment.  Patient was last seen in primary care on 03/12/2023 by Berneta Elsie Sayre, MD.  Called Nurse Triage reporting Chest Pain.  Symptoms began today.  Interventions attempted: Nothing.  Symptoms are: unchanged.  Triage Disposition: Go to ED Now (Notify PCP)  Patient/caregiver understands and will follow disposition?: Yes  Copied from CRM [id number]. Topic: Clinical - Red Word Triage >> Feb 04, 2024  4:00 PM Frederich PARAS wrote: [facility] that prompted transfer to Nurse Triage: pain in left upper chest  Pt called to schedule a physical , but pt says he is having pain in upper left chest, pt denied scheduling. Reason for Disposition  Pain also in shoulder(s) or arm(s) or jaw  (Exception: Pain is clearly made worse by movement.)  Answer Assessment - Initial Assessment Questions Patient only complaints is chest pain that radiates to shoulder. Patient advised to go to ER. Still wants to set up appointment for physical. 1. LOCATION: Where does it hurt?       Upper chest and radiates towards left shoulder. 3. ONSET: When did the chest pain begin? (Minutes, hours or days)      This morning 4. PATTERN: Does the pain come and go, or has it been constant since it started?  Does it get worse with exertion?      Constant  6. SEVERITY: How bad is the pain?  (e.g., Scale 1-10; mild, moderate, or severe)     3 7. CARDIAC RISK FACTORS: Do you have any history of heart problems or risk factors for heart disease? (e.g., angina, prior heart attack; diabetes, high blood pressure, high cholesterol, smoker, or strong family history of heart disease)     Denies 8. PULMONARY RISK FACTORS: Do you have any history of lung disease?  (e.g., blood clots in lung, asthma, emphysema, birth control pills)     Denies 9. CAUSE: What do you think is causing the chest pain?     Unsure 10. OTHER SYMPTOMS: Do you have any other  symptoms? (e.g., dizziness, nausea, vomiting, sweating, fever, difficulty breathing, cough)       Denies  Protocols used: Chest Pain-A-AH

## 2024-02-05 ENCOUNTER — Other Ambulatory Visit: Payer: Self-pay

## 2024-02-05 ENCOUNTER — Emergency Department (HOSPITAL_BASED_OUTPATIENT_CLINIC_OR_DEPARTMENT_OTHER): Admitting: Radiology

## 2024-02-05 ENCOUNTER — Emergency Department (HOSPITAL_BASED_OUTPATIENT_CLINIC_OR_DEPARTMENT_OTHER)
Admission: EM | Admit: 2024-02-05 | Discharge: 2024-02-05 | Disposition: A | Discharge status: Home / Self Care | Source: Ambulatory Visit | Attending: Emergency Medicine | Admitting: Emergency Medicine

## 2024-02-05 ENCOUNTER — Encounter (HOSPITAL_BASED_OUTPATIENT_CLINIC_OR_DEPARTMENT_OTHER): Payer: Self-pay

## 2024-02-05 DIAGNOSIS — R079 Chest pain, unspecified: Secondary | ICD-10-CM

## 2024-02-05 LAB — BASIC METABOLIC PANEL WITH GFR
Anion gap: 10 (ref 5–15)
BUN: 11 mg/dL (ref 6–20)
CO2: 26 mmol/L (ref 22–32)
Calcium: 9.5 mg/dL (ref 8.9–10.3)
Chloride: 107 mmol/L (ref 98–111)
Creatinine, Ser: 0.97 mg/dL (ref 0.61–1.24)
GFR, Estimated: >60 mL/min (ref >60)
Glucose, Bld: 97 mg/dL (ref 70–99)
Potassium: 3.9 mmol/L (ref 3.5–5.1)
Sodium: 142 mmol/L (ref 135–145)

## 2024-02-05 LAB — CBC
HCT: 42.4 % (ref 39.0–52.0)
Hemoglobin: 14.8 g/dL (ref 13.0–17.0)
MCH: 32.6 pg (ref 26.0–34.0)
MCHC: 34.9 g/dL (ref 30.0–36.0)
MCV: 93.4 fL (ref 80.0–100.0)
Platelets: 217 K/uL (ref 150–400)
RBC: 4.54 MIL/uL (ref 4.22–5.81)
RDW: 12.6 % (ref 11.5–15.5)
WBC: 7.1 K/uL (ref 4.0–10.5)
nRBC: 0 % (ref 0.0–0.2)

## 2024-02-05 LAB — TROPONIN T, HIGH SENSITIVITY
Troponin T High Sensitivity: <15 ng/L (ref 0–19)
Troponin T High Sensitivity: <15 ng/L (ref 0–19)

## 2024-02-05 MED ORDER — ASPIRIN 81 MG PO CHEW
324.0000 mg | CHEWABLE_TABLET | Freq: Once | ORAL | Status: AC
  Administered 2024-02-05: 324 mg via ORAL
  Filled 2024-02-05: qty 4

## 2024-02-05 MED ORDER — KETOROLAC TROMETHAMINE 30 MG/ML IJ SOLN
30.0000 mg | Freq: Once | INTRAMUSCULAR | Status: AC
  Administered 2024-02-05: 30 mg via INTRAVENOUS
  Filled 2024-02-05: qty 1

## 2024-02-05 NOTE — Telephone Encounter (Signed)
 Left VM for patient regarding follow up appt from ER visit today. Nothing until Jan 30 but advised to call each day to see if anything opens up.

## 2024-02-05 NOTE — ED Triage Notes (Signed)
 L sided chest pain starting this morning. Pain and pressure getting worse this evening. Denies SOB, cough, fever.

## 2024-02-05 NOTE — Discharge Instructions (Signed)
 Apply ice to the area that is painful.  Ice can be applied for 30 minutes at a time, 4 times a day.  You may take acetaminophen  and/or ibuprofen  as needed for pain.  Please be aware that if you combine acetaminophen  and ibuprofen , you will get better pain relief than you get from taking either medication by itself.  You can significantly reduce your risk of developing heart problems in the future if you stop smoking.

## 2024-02-05 NOTE — ED Provider Notes (Signed)
 [place] EMERGENCY DEPARTMENT AT [facility] Provider Note   CSN: [id number] Arrival date & time: 02/05/24  9965     Patient presents with: No chief complaint on file.   Mason Perez is a 38 y.o. male.   The history is provided by the patient.   He has no significant past history and comes in complaining of left-sided chest pain since this afternoon.  He describes a pressure feeling in the left anterior chest.  It is worse when he holds his arm in certain positions and worse when he coughs.  Pain is not affected by deep breaths.  He denies dyspnea, nausea, diaphoresis.  He denies any recent trauma or recent infections.  He is a cigarette smoker but denies history of hypertension or diabetes or hyperlipidemia and denies family history of premature coronary atherosclerosis.    Prior to Admission medications   Medication Sig Start Date End Date Taking? Authorizing Provider  atorvastatin  (LIPITOR) 40 MG tablet Take 1 tablet (40 mg total) by mouth daily. 04/14/20   Thedora Garnette HERO, MD  fluconazole  (DIFLUCAN ) 150 MG tablet Take 1 tablet (150 mg total) by mouth once a week. 11/21/22   Billy Knee, FNP  meloxicam  (MOBIC ) 15 MG tablet Take 1 tablet (15 mg total) by mouth daily. 11/21/22   Billy Knee, FNP    Allergies: Patient has no known allergies.    Review of Systems  All other systems reviewed and are negative.   Updated Vital Signs BP 106/89   Pulse 60   Temp 97.6 F (36.4 C) (Oral)   Resp 20   SpO2 97%   Physical Exam Vitals and nursing note reviewed.   38 year old male, resting comfortably and in no acute distress. Vital signs are significant for elevated respiratory rate and borderline elevated blood pressure. Oxygen saturation is 95%, which is normal. Head is normocephalic and atraumatic. PERRLA, EOMI. Oropharynx is clear. Neck is nontender and supple without adenopathy. Lungs are clear without rales, wheezes, or rhonchi. Chest is mildly tender in the  left anterolateral chest wall, and this does reproduce his pain. Heart has regular rate and rhythm without murmur. Abdomen is soft, flat, nontender. Extremities have no cyanosis or edema, full range of motion is present. Skin is warm and dry without rash. Neurologic: Mental status is normal, moves all extremities equally.  (all labs ordered are listed, but only abnormal results are displayed) Labs Reviewed  BASIC METABOLIC PANEL WITH GFR  CBC  TROPONIN T, HIGH SENSITIVITY  TROPONIN T, HIGH SENSITIVITY    EKG: EKG Interpretation Date/Time:  Tuesday February 05 2024 00:41:04 EST Ventricular Rate:  86 PR Interval:  159 QRS Duration:  106 QT Interval:  377 QTC Calculation: 451 R Axis:   63  Text Interpretation: duplilcate, discard Confirmed by Raford Lenis ([id number]) on 02/05/2024 12:44:37 AM  Radiology: [facility] Chest 2 View Result Date: 02/05/2024 EXAM: 2 VIEW(S) XRAY OF THE CHEST 02/05/2024 12:58:00 AM COMPARISON: Chest x-ray 12/08/2015. CLINICAL HISTORY: chest pain FINDINGS: LUNGS AND PLEURA: No focal pulmonary opacity. No pleural effusion. No pneumothorax. HEART AND MEDIASTINUM: No acute abnormality of the cardiac and mediastinal silhouettes. BONES AND SOFT TISSUES: No acute osseous abnormality. IMPRESSION: 1. No acute cardiopulmonary process. Electronically signed by: Morgane Naveau MD 02/05/2024 01:01 AM EST RP Workstation: [id number]    Cardiac monitor shows normal sinus rhythm, per my interpretation.  Procedures   Medications Ordered in the ED  aspirin  chewable tablet 324 mg (324 mg Oral Given 02/05/24 0057)  ketorolac  (  TORADOL ) 30 MG/ML injection 30 mg (30 mg Intravenous Given 02/05/24 0058)                HEART Score: 1                    Medical Decision Making Amount and/or Complexity of Data Reviewed Labs: ordered. Radiology: ordered.  Risk OTC drugs. Prescription drug management.   Chest pain will which seems much more likely to be musculoskeletal than cardiac or  pulmonary.  However, differential diagnosis does include ACS, pulmonary embolism, pneumonia, pleurisy, GERD.  I have reviewed his electrocardiogram, my interpretation is normal ECG.  I have ordered a dose of aspirin  and also dose of ketorolac .  I have reviewed his past records, and I see no relevant past visits.  He has no risk factors for pulmonary embolism and his only risk factor for coronary artery disease is cigarette smoking.  Chest x-ray shows no acute cardiopulmonary process.  Have independently viewed the images, and agree with the radiologist's interpretation.  I have reviewed his laboratory tests, and my interpretation is normal CBC and normal basic metabolic panel and normal troponin x 2.  He noted slight relief of discomfort with ketorolac .  Heart score is 1, which puts him at low risk for major adverse cardiac events in the next 6 weeks.  I have reassured him that he is at very low risk for cardiac disease but I have encouraged him to stop smoking.  I have advised use of ice and over-the-counter NSAIDs and acetaminophen .  Return precautions discussed.     Final diagnoses:  Nonspecific chest pain    ED Discharge Orders     None          Raford Lenis, MD 02/05/24 [phone]

## 2024-03-22 ENCOUNTER — Emergency Department (HOSPITAL_BASED_OUTPATIENT_CLINIC_OR_DEPARTMENT_OTHER)

## 2024-03-22 ENCOUNTER — Other Ambulatory Visit: Payer: Self-pay

## 2024-03-22 ENCOUNTER — Encounter (HOSPITAL_BASED_OUTPATIENT_CLINIC_OR_DEPARTMENT_OTHER): Payer: Self-pay | Admitting: *Deleted

## 2024-03-22 ENCOUNTER — Emergency Department (HOSPITAL_BASED_OUTPATIENT_CLINIC_OR_DEPARTMENT_OTHER)
Admission: EM | Admit: 2024-03-22 | Discharge: 2024-03-22 | Disposition: A | Discharge status: Home / Self Care | Attending: Emergency Medicine | Admitting: Emergency Medicine

## 2024-03-22 ENCOUNTER — Emergency Department (HOSPITAL_BASED_OUTPATIENT_CLINIC_OR_DEPARTMENT_OTHER): Admitting: Radiology

## 2024-03-22 DIAGNOSIS — S0990XA Unspecified injury of head, initial encounter: Secondary | ICD-10-CM | POA: Diagnosis present

## 2024-03-22 DIAGNOSIS — R1032 Left lower quadrant pain: Secondary | ICD-10-CM | POA: Insufficient documentation

## 2024-03-22 DIAGNOSIS — M79642 Pain in left hand: Secondary | ICD-10-CM | POA: Insufficient documentation

## 2024-03-22 DIAGNOSIS — R911 Solitary pulmonary nodule: Secondary | ICD-10-CM | POA: Diagnosis not present

## 2024-03-22 DIAGNOSIS — M25512 Pain in left shoulder: Secondary | ICD-10-CM | POA: Insufficient documentation

## 2024-03-22 DIAGNOSIS — X58XXXA Exposure to other specified factors, initial encounter: Secondary | ICD-10-CM | POA: Diagnosis not present

## 2024-03-22 DIAGNOSIS — S0093XA Contusion of unspecified part of head, initial encounter: Secondary | ICD-10-CM | POA: Diagnosis not present

## 2024-03-22 DIAGNOSIS — Z79899 Other long term (current) drug therapy: Secondary | ICD-10-CM | POA: Diagnosis not present

## 2024-03-22 DIAGNOSIS — F17200 Nicotine dependence, unspecified, uncomplicated: Secondary | ICD-10-CM | POA: Diagnosis not present

## 2024-03-22 DIAGNOSIS — M542 Cervicalgia: Secondary | ICD-10-CM | POA: Diagnosis not present

## 2024-03-22 DIAGNOSIS — M25561 Pain in right knee: Secondary | ICD-10-CM | POA: Insufficient documentation

## 2024-03-22 LAB — CBC
HCT: 39.3 % (ref 39.0–52.0)
Hemoglobin: 13.7 g/dL (ref 13.0–17.0)
MCH: 32.6 pg (ref 26.0–34.0)
MCHC: 34.9 g/dL (ref 30.0–36.0)
MCV: 93.6 fL (ref 80.0–100.0)
Platelets: 193 10*3/uL (ref 150–400)
RBC: 4.2 MIL/uL — ABNORMAL LOW (ref 4.22–5.81)
RDW: 12.8 % (ref 11.5–15.5)
WBC: 7.7 10*3/uL (ref 4.0–10.5)
nRBC: 0 % (ref 0.0–0.2)

## 2024-03-22 LAB — COMPREHENSIVE METABOLIC PANEL WITH GFR
ALT: 27 U/L (ref 0–44)
AST: 20 U/L (ref 15–41)
Albumin: 3.9 g/dL (ref 3.5–5.0)
Alkaline Phosphatase: 62 U/L (ref 38–126)
Anion gap: 10 (ref 5–15)
BUN: 13 mg/dL (ref 6–20)
CO2: 23 mmol/L (ref 22–32)
Calcium: 8.7 mg/dL — ABNORMAL LOW (ref 8.9–10.3)
Chloride: 101 mmol/L (ref 98–111)
Creatinine, Ser: 0.76 mg/dL (ref 0.61–1.24)
GFR, Estimated: >60 mL/min
Glucose, Bld: 109 mg/dL — ABNORMAL HIGH (ref 70–99)
Potassium: 3.9 mmol/L (ref 3.5–5.1)
Sodium: 134 mmol/L — ABNORMAL LOW (ref 135–145)
Total Bilirubin: 0.4 mg/dL (ref 0.0–1.2)
Total Protein: 6.3 g/dL — ABNORMAL LOW (ref 6.5–8.1)

## 2024-03-22 LAB — ETHANOL: Alcohol, Ethyl (B): <15 mg/dL

## 2024-03-22 MED ORDER — IOHEXOL 350 MG/ML SOLN
100.0000 mL | Freq: Once | INTRAVENOUS | Status: AC | PRN
  Administered 2024-03-22: 100 mL via INTRAVENOUS

## 2024-03-22 MED ORDER — KETOROLAC TROMETHAMINE 15 MG/ML IJ SOLN
15.0000 mg | Freq: Once | INTRAMUSCULAR | Status: AC
  Administered 2024-03-22: 15 mg via INTRAVENOUS
  Filled 2024-03-22: qty 1

## 2024-03-22 MED ORDER — FENTANYL CITRATE (PF) 50 MCG/ML IJ SOSY
50.0000 ug | PREFILLED_SYRINGE | Freq: Once | INTRAMUSCULAR | Status: AC
  Administered 2024-03-22: 50 ug via INTRAVENOUS
  Filled 2024-03-22: qty 1

## 2024-03-22 NOTE — Discharge Instructions (Addendum)
 Recommend 1000 mg of Tylenol  every 6 hours as needed for pain, recommend 400 mg ibuprofen  every 8 hours as needed for pain ice any affected areas 20 minutes on several times a day  You do have incidental pulmonary nodules that we will likely need to be reimaged in the next several months.  Talk to your primary care doctor about this.
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
